# Patient Record
Sex: Female | Born: 2000 | Race: Black or African American | Hispanic: No | Marital: Single | Smoking: Never smoker
Health system: Southern US, Community
[De-identification: ages and names within clinical notes are randomized; demographics above are authoritative.]

## PROBLEM LIST (undated history)

## (undated) ENCOUNTER — Ambulatory Visit (HOSPITAL_COMMUNITY): Admission: EM | Payer: BC Managed Care – PPO | Source: Home / Self Care

## (undated) ENCOUNTER — Emergency Department (HOSPITAL_COMMUNITY): Admission: EM | Payer: Self-pay | Source: Home / Self Care

## (undated) DIAGNOSIS — G4730 Sleep apnea, unspecified: Secondary | ICD-10-CM

## (undated) DIAGNOSIS — R87610 Atypical squamous cells of undetermined significance on cytologic smear of cervix (ASC-US): Secondary | ICD-10-CM

## (undated) DIAGNOSIS — E109 Type 1 diabetes mellitus without complications: Secondary | ICD-10-CM

## (undated) DIAGNOSIS — E559 Vitamin D deficiency, unspecified: Secondary | ICD-10-CM

## (undated) DIAGNOSIS — Z789 Other specified health status: Secondary | ICD-10-CM

## (undated) HISTORY — DX: Type 1 diabetes mellitus without complications: E10.9

## (undated) HISTORY — DX: Vitamin D deficiency, unspecified: E55.9

## (undated) HISTORY — DX: Atypical squamous cells of undetermined significance on cytologic smear of cervix (ASC-US): R87.610

## (undated) HISTORY — DX: Sleep apnea, unspecified: G47.30

---
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
## 2021-03-11 ENCOUNTER — Encounter: Payer: Self-pay | Admitting: Nurse Practitioner

## 2021-03-25 ENCOUNTER — Ambulatory Visit (INDEPENDENT_AMBULATORY_CARE_PROVIDER_SITE_OTHER): Payer: Self-pay | Admitting: Nurse Practitioner

## 2021-03-25 ENCOUNTER — Encounter: Payer: Self-pay | Admitting: Nurse Practitioner

## 2021-03-25 ENCOUNTER — Other Ambulatory Visit (INDEPENDENT_AMBULATORY_CARE_PROVIDER_SITE_OTHER): Payer: 59

## 2021-03-25 VITALS — BP 108/64 | HR 67 | Ht 67.0 in | Wt 182.0 lb

## 2021-03-25 DIAGNOSIS — R197 Diarrhea, unspecified: Secondary | ICD-10-CM

## 2021-03-25 DIAGNOSIS — R109 Unspecified abdominal pain: Secondary | ICD-10-CM

## 2021-03-25 LAB — SEDIMENTATION RATE: Sed Rate: 26 mm/hr — ABNORMAL HIGH (ref 0–20)

## 2021-03-25 LAB — CBC
HCT: 42.8 % (ref 36.0–49.0)
Hemoglobin: 13.9 g/dL (ref 12.0–16.0)
MCHC: 32.5 g/dL (ref 31.0–37.0)
MCV: 79.4 fl (ref 78.0–98.0)
Platelets: 259 10*3/uL (ref 150.0–575.0)
RBC: 5.39 Mil/uL (ref 3.80–5.70)
RDW: 14.5 % (ref 11.4–15.5)
WBC: 7.3 10*3/uL (ref 4.5–13.5)

## 2021-03-25 LAB — C-REACTIVE PROTEIN: CRP: <1 mg/dL (ref 0.5–20.0)

## 2021-03-25 MED ORDER — DICYCLOMINE HCL 10 MG PO CAPS: 10.0000 mg | ORAL_CAPSULE | Freq: Two times a day (BID) | ORAL | 2 refills | Status: DC | PRN

## 2021-03-25 NOTE — Patient Instructions (Signed)
Your provider has requested that you go to the basement level for lab work before leaving today. Press "B" on the elevator. The lab is located at the first door on the left as you exit the elevator.  We have sent the following medications to your pharmacy for you to pick up at your convenience: Bentyl 10 mg twice daily as needed for cramps  Please purchase the following medications over the counter and take as directed: Imodium 2 mg twice daily  If you are age 100 or younger, your body mass index should be between 19-25. Your Body mass index is 28.51 kg/m. If this is out of the aformentioned range listed, please consider follow up with your Primary Care Provider.   Due to recent changes in healthcare laws, you may see the results of your imaging and laboratory studies on MyChart before your provider has had a chance to review them.  We understand that in some cases there may be results that are confusing or concerning to you. Not all laboratory results come back in the same time frame and the provider may be waiting for multiple results in order to interpret others.  Please give [place] 48 hours in order for your provider to thoroughly review all the results before contacting the office for clarification of your results.

## 2021-03-25 NOTE — Progress Notes (Signed)
Reviewed and agree with management plans. Will include testing for celiac and fecal calprotectin in her evaluation.   Kasee Hantz L. Orvan Falconer, MD, MPH

## 2021-03-25 NOTE — Progress Notes (Signed)
ASSESSMENT AND PLAN    # 20 yo female with chronic, crampy diarrhea for years. Strong family history of Crohn's disease but I suspect she has IBS.  --CBC ESR, CRP --Trial of Dicyclomine 10 mg TID prn cramps --Trial of Imodium 2 mg BID. Hold for constipation  --Avoid trigger foods --Follow up with me in a few weeks.   # Anxiety, significant per mother.   HISTORY OF PRESENT ILLNESS     Chief Complaint : Abdominal pain  Kristina Sparks is a 20 y.o. female with a past medical history significant for anxiety. See additional PMH below.   Patient is new to the practice, self referred for diarrhea and abdominal cramps.  In middle school patient was chronically constipated and required MiraLAX.  The MiraLAX eventually stopped working so she discontinued it.  At that point she started eating more vegetables and began having problems with loose stool.  Over the last several years her bowel habits have mainly consisted of several loose stools a day, mainly postprandial.  She has not seen any blood in her stool.  Bowel movements are often preceded by lower abdominal cramping which is mitigated but not totally alleviated with defecation.  Diarrhea is definitely worse when she is anxious/stressed.  Diarrhea is also exacerbated by consumption of greasy foods and dairy. In the last six months she has occasionally woken up during the night to have a BM.  Patient's mother is here today and helps with the history.  She offers that the patient is often severely anxious and she has had a heavy class schedule in college.   Kristina Sparks has not tried anything over the counter to help with the diarrhea.  She will sometimes take motrin for abdominal cramps.  She endorses generalized achiness and also also frequent aching in her right hand and both knees.   Patient's maternal greatgradmother, maternal great uncle, and uncle all have Crohn's.   Past Medical History:  Diagnosis Date  . Medical history  non-contributory      No past surgical history on file. Family History  Problem Relation Age of Onset  . Cancer Paternal Grandmother   . Breast cancer Maternal Grandmother    Social History   Tobacco Use  . Smoking status: Never Smoker  . Smokeless tobacco: Never Used  Vaping Use  . Vaping Use: Never used  Substance Use Topics  . Alcohol use: No  . Drug use: No   Current Outpatient Medications  Medication Sig Dispense Refill  . acetaminophen (TYLENOL) 325 MG tablet Take 2 tablets (650 mg total) by mouth every 6 (six) hours as needed. 30 tablet 0  . cetirizine (ZYRTEC ALLERGY) 10 MG tablet Take 1 tablet (10 mg total) by mouth daily. 14 tablet 0  . Elderberry 500 MG CAPS Take 1 capsule by mouth daily.    . Multiple Vitamins-Minerals (MULTIVITAMIN PO) Take 1 tablet by mouth daily.     No current facility-administered medications for this visit.   No Known Allergies   Review of Systems: Positive for allergies, sinus trouble, anxiety, back pain, depression, menstrual pain, sleeping problems.  All other systems reviewed and negative except where noted in HPI.    PHYSICAL EXAM :    Wt Readings from Last 3 Encounters:  03/25/21 182 lb (82.6 kg) (95 %, Z= 1.68)*  09/17/19 180 lb (81.6 kg) (96 %, Z= 1.72)*  06/16/17 166 lb 0.1 oz (75.3 kg) (95 %, Z= 1.63)*   * Growth percentiles are based  on CDC (Girls, 2-20 Years) data.    Ht 5' 7"  (1.702 m)   Wt 182 lb (82.6 kg)   BMI 28.51 kg/m  Constitutional:  Pleasant female in no acute distress. Psychiatric: Normal mood and affect. Behavior is normal. EENT: Pupils normal.  Conjunctivae are normal. No scleral icterus. Neck supple.  Cardiovascular: Normal rate, regular rhythm. No edema Pulmonary/chest: Effort normal and breath sounds normal. No wheezing, rales or rhonchi. Abdominal: Soft, nondistended, nontender. Bowel sounds active throughout. There are no masses palpable. No hepatomegaly. Neurological: Alert and oriented to  person place and time. Skin: Skin is warm and dry. No rashes noted.  Tye Savoy, NP  03/25/2021, 8:37 AM

## 2021-03-26 ENCOUNTER — Other Ambulatory Visit: Payer: Self-pay

## 2021-03-26 DIAGNOSIS — R197 Diarrhea, unspecified: Secondary | ICD-10-CM

## 2021-08-11 ENCOUNTER — Encounter (HOSPITAL_COMMUNITY): Payer: Self-pay | Admitting: Emergency Medicine

## 2021-08-11 ENCOUNTER — Ambulatory Visit (HOSPITAL_COMMUNITY)
Admission: EM | Admit: 2021-08-11 | Discharge: 2021-08-11 | Disposition: A | Discharge status: Home / Self Care | Payer: Self-pay | Attending: Internal Medicine | Admitting: Internal Medicine

## 2021-08-11 ENCOUNTER — Other Ambulatory Visit: Payer: Self-pay

## 2021-08-11 DIAGNOSIS — Z113 Encounter for screening for infections with a predominantly sexual mode of transmission: Secondary | ICD-10-CM | POA: Insufficient documentation

## 2021-08-11 DIAGNOSIS — N898 Other specified noninflammatory disorders of vagina: Secondary | ICD-10-CM | POA: Insufficient documentation

## 2021-08-11 DIAGNOSIS — L0291 Cutaneous abscess, unspecified: Secondary | ICD-10-CM | POA: Insufficient documentation

## 2021-08-11 MED ORDER — FLUCONAZOLE 150 MG PO TABS: ORAL_TABLET | ORAL | 0 refills | Status: DC

## 2021-08-11 MED ORDER — DOXYCYCLINE HYCLATE 100 MG PO CAPS: 100.0000 mg | ORAL_CAPSULE | Freq: Two times a day (BID) | ORAL | 0 refills | Status: DC

## 2021-08-11 NOTE — Discharge Instructions (Addendum)
Avoid wearing polyester underwear and only use cotton Keep your vaginal are dry as best as you can Apply heat on area for 20 minutes 2-4 times a day for 5-7 days or til it drains. If the area gets larger without draining, then come back for [place] to drain it.

## 2021-08-11 NOTE — ED Triage Notes (Addendum)
Pt is present today with a mass on the lower left side of her groin. Pt states that she noticed it Sunday night. Pt also has urine frequency, dysuria, vaginal discharge, and vaginal irritation that started one week ago.

## 2021-08-12 LAB — CERVICOVAGINAL ANCILLARY ONLY
Bacterial Vaginitis (gardnerella): NEGATIVE
Candida Glabrata: NEGATIVE
Candida Vaginitis: POSITIVE — AB
Chlamydia: NEGATIVE
Comment: NEGATIVE
Comment: NEGATIVE
Comment: NEGATIVE
Comment: NEGATIVE
Comment: NEGATIVE
Comment: NORMAL
Neisseria Gonorrhea: NEGATIVE
Trichomonas: NEGATIVE

## 2021-08-30 ENCOUNTER — Other Ambulatory Visit: Payer: Self-pay

## 2021-08-30 ENCOUNTER — Observation Stay (HOSPITAL_COMMUNITY)
Admission: EM | Admit: 2021-08-30 | Discharge: 2021-09-01 | Disposition: A | Discharge status: Home / Self Care | Payer: Medicaid Other | Attending: Internal Medicine | Admitting: Internal Medicine

## 2021-08-30 ENCOUNTER — Encounter (HOSPITAL_COMMUNITY): Payer: Self-pay

## 2021-08-30 ENCOUNTER — Emergency Department (HOSPITAL_COMMUNITY): Payer: Medicaid Other

## 2021-08-30 DIAGNOSIS — B3731 Acute candidiasis of vulva and vagina: Secondary | ICD-10-CM

## 2021-08-30 DIAGNOSIS — Z79899 Other long term (current) drug therapy: Secondary | ICD-10-CM | POA: Insufficient documentation

## 2021-08-30 DIAGNOSIS — N939 Abnormal uterine and vaginal bleeding, unspecified: Principal | ICD-10-CM | POA: Insufficient documentation

## 2021-08-30 DIAGNOSIS — E119 Type 2 diabetes mellitus without complications: Secondary | ICD-10-CM

## 2021-08-30 DIAGNOSIS — E109 Type 1 diabetes mellitus without complications: Secondary | ICD-10-CM | POA: Insufficient documentation

## 2021-08-30 DIAGNOSIS — Z794 Long term (current) use of insulin: Secondary | ICD-10-CM | POA: Insufficient documentation

## 2021-08-30 DIAGNOSIS — Z20822 Contact with and (suspected) exposure to covid-19: Secondary | ICD-10-CM | POA: Insufficient documentation

## 2021-08-30 LAB — COMPREHENSIVE METABOLIC PANEL
ALT: 35 U/L (ref 0–44)
AST: 27 U/L (ref 15–41)
Albumin: 4.4 g/dL (ref 3.5–5.0)
Alkaline Phosphatase: 76 U/L (ref 38–126)
Anion gap: 12 (ref 5–15)
BUN: 12 mg/dL (ref 6–20)
CO2: 23 mmol/L (ref 22–32)
Calcium: 9.2 mg/dL (ref 8.9–10.3)
Chloride: 98 mmol/L (ref 98–111)
Creatinine, Ser: 0.76 mg/dL (ref 0.44–1.00)
GFR, Estimated: >60 mL/min (ref >60)
Glucose, Bld: 347 mg/dL — ABNORMAL HIGH (ref 70–99)
Potassium: 3.5 mmol/L (ref 3.5–5.1)
Sodium: 133 mmol/L — ABNORMAL LOW (ref 135–145)
Total Bilirubin: 0.7 mg/dL (ref 0.3–1.2)
Total Protein: 8.8 g/dL — ABNORMAL HIGH (ref 6.5–8.1)

## 2021-08-30 LAB — CBC WITH DIFFERENTIAL/PLATELET
Abs Immature Granulocytes: 0.01 10*3/uL (ref 0.00–0.07)
Basophils Absolute: 0 10*3/uL (ref 0.0–0.1)
Basophils Relative: 1 %
Eosinophils Absolute: 0.1 10*3/uL (ref 0.0–0.5)
Eosinophils Relative: 2 %
HCT: 45.7 % (ref 36.0–46.0)
Hemoglobin: 15 g/dL (ref 12.0–15.0)
Immature Granulocytes: 0 %
Lymphocytes Relative: 51 %
Lymphs Abs: 3.3 10*3/uL (ref 0.7–4.0)
MCH: 26.2 pg (ref 26.0–34.0)
MCHC: 32.8 g/dL (ref 30.0–36.0)
MCV: 79.9 fL — ABNORMAL LOW (ref 80.0–100.0)
Monocytes Absolute: 0.3 10*3/uL (ref 0.1–1.0)
Monocytes Relative: 5 %
Neutro Abs: 2.6 10*3/uL (ref 1.7–7.7)
Neutrophils Relative %: 41 %
Platelets: 322 10*3/uL (ref 150–400)
RBC: 5.72 MIL/uL — ABNORMAL HIGH (ref 3.87–5.11)
RDW: 12.9 % (ref 11.5–15.5)
WBC: 6.4 10*3/uL (ref 4.0–10.5)
nRBC: 0 % (ref 0.0–0.2)

## 2021-08-30 LAB — CBG MONITORING, ED: Glucose-Capillary: 356 mg/dL — ABNORMAL HIGH (ref 70–99)

## 2021-08-30 LAB — BETA-HYDROXYBUTYRIC ACID: Beta-Hydroxybutyric Acid: 1.15 mmol/L — ABNORMAL HIGH (ref 0.05–0.27)

## 2021-08-30 LAB — I-STAT BETA HCG BLOOD, ED (MC, WL, AP ONLY): I-stat hCG, quantitative: <5 m[IU]/mL (ref <5)

## 2021-08-30 NOTE — ED Notes (Signed)
Pt. CBG 356, RN, TJ made aware.

## 2021-08-30 NOTE — ED Triage Notes (Signed)
Pt states that she went to [facility] today and was told that her blood glucose was in the 400's. Pt does not report a history of diabetes. Pt also states that she has been recently experiencing heavy vaginal bleeding that is not her period.

## 2021-08-30 NOTE — ED Provider Notes (Signed)
Emergency Medicine Provider Triage Evaluation Note  Kristina Sparks , a 20 y.o. female  was evaluated in triage.  Pt complains of vaginal bleeding, pelvic pain, generalized abdominal pain, and hyperglycemia.  Patient has been having vaginal bleeding, pelvic, and generalized abdominal pain over the last 3 to 4 days.  Patient reports that she is changing her pad or tampon every 2 hours.  Patient reports that she is passing large blood clots  Patient had blood work done earlier today which showed her blood sugar was greater than 400.  No history of diabetes.  Patient has had polydipsia and polyuria  Review of Systems  Positive: Vaginal bleeding, pelvic pain, abdominal pain, polyuria, polydipsia, dysuria, Negative:   Physical Exam  BP 108/83 (BP Location: Left Arm)   Pulse 70   Temp 98.1 F (36.7 C) (Oral)   Resp 16   SpO2 98%  Gen:   Awake, no distress   Resp:  Normal effort  MSK:   Moves extremities without difficulty  Other:  Abdomen soft, nondistended, nontender.  Medical Decision Making  Medically screening exam initiated at 8:41 PM.  Appropriate orders placed.  Elsia Lasota Rothery was informed that the remainder of the evaluation will be completed by another provider, this initial triage assessment does not replace that evaluation, and the importance of remaining in the ED until their evaluation is complete.     Berneice Heinrich 08/30/21 2042    Wynetta Fines, MD 08/30/21 [phone]

## 2021-08-30 NOTE — ED Notes (Signed)
Attempted lab draw, pt. Became agitated and refused lab draw. RN, TJ made aware.

## 2021-08-31 ENCOUNTER — Encounter (HOSPITAL_COMMUNITY): Payer: Self-pay | Admitting: Internal Medicine

## 2021-08-31 DIAGNOSIS — N939 Abnormal uterine and vaginal bleeding, unspecified: Secondary | ICD-10-CM | POA: Diagnosis present

## 2021-08-31 DIAGNOSIS — E119 Type 2 diabetes mellitus without complications: Secondary | ICD-10-CM

## 2021-08-31 LAB — URINALYSIS, ROUTINE W REFLEX MICROSCOPIC
Bacteria, UA: NONE SEEN
Bilirubin Urine: NEGATIVE
Glucose, UA: >=500 mg/dL — AB
Ketones, ur: 80 mg/dL — AB
Nitrite: NEGATIVE
Protein, ur: NEGATIVE mg/dL
Specific Gravity, Urine: 1.042 — ABNORMAL HIGH (ref 1.005–1.030)
pH: 5 (ref 5.0–8.0)

## 2021-08-31 LAB — WET PREP, GENITAL
Clue Cells Wet Prep HPF POC: NONE SEEN
Sperm: NONE SEEN
Trich, Wet Prep: NONE SEEN
Yeast Wet Prep HPF POC: NONE SEEN

## 2021-08-31 LAB — CBG MONITORING, ED
Glucose-Capillary: 338 mg/dL — ABNORMAL HIGH (ref 70–99)
Glucose-Capillary: 236 mg/dL — ABNORMAL HIGH (ref 70–99)
Glucose-Capillary: 220 mg/dL — ABNORMAL HIGH (ref 70–99)
Glucose-Capillary: 232 mg/dL — ABNORMAL HIGH (ref 70–99)

## 2021-08-31 LAB — HEMOGLOBIN A1C
Hgb A1c MFr Bld: 13.3 % — ABNORMAL HIGH (ref 4.8–5.6)
Mean Plasma Glucose: 335.01 mg/dL

## 2021-08-31 LAB — RESP PANEL BY RT-PCR (FLU A&B, COVID) ARPGX2
Influenza A by PCR: NEGATIVE
Influenza B by PCR: NEGATIVE
SARS Coronavirus 2 by RT PCR: NEGATIVE

## 2021-08-31 MED ORDER — INSULIN ASPART 100 UNIT/ML IJ SOLN
5.0000 [IU] | Freq: Once | INTRAMUSCULAR | Status: AC
  Administered 2021-08-31: 5 [IU] via SUBCUTANEOUS
  Filled 2021-08-31: qty 0.05

## 2021-08-31 MED ORDER — ACETAMINOPHEN 650 MG RE SUPP: 650.0000 mg | Freq: Four times a day (QID) | RECTAL | Status: DC | PRN

## 2021-08-31 MED ORDER — INSULIN ASPART 100 UNIT/ML IJ SOLN
0.0000 [IU] | Freq: Three times a day (TID) | INTRAMUSCULAR | Status: DC
  Administered 2021-08-31 (×2): 3 [IU] via SUBCUTANEOUS
  Administered 2021-09-01 (×2): 5 [IU] via SUBCUTANEOUS
  Filled 2021-08-31: qty 0.09

## 2021-08-31 MED ORDER — ACETAMINOPHEN 325 MG PO TABS
650.0000 mg | ORAL_TABLET | Freq: Four times a day (QID) | ORAL | Status: DC | PRN
  Administered 2021-08-31: 650 mg via ORAL
  Filled 2021-08-31: qty 2

## 2021-08-31 MED ORDER — ONDANSETRON HCL 4 MG PO TABS: 4.0000 mg | ORAL_TABLET | Freq: Four times a day (QID) | ORAL | Status: DC | PRN

## 2021-08-31 MED ORDER — INSULIN GLARGINE-YFGN 100 UNIT/ML ~~LOC~~ SOLN
8.0000 [IU] | Freq: Every day | SUBCUTANEOUS | Status: DC
  Administered 2021-08-31 – 2021-09-01 (×2): 8 [IU] via SUBCUTANEOUS
  Filled 2021-08-31 (×2): qty 0.08

## 2021-08-31 MED ORDER — SODIUM CHLORIDE 0.45 % IV SOLN: INTRAVENOUS | Status: DC

## 2021-08-31 MED ORDER — SODIUM CHLORIDE 0.45 % IV BOLUS
1000.0000 mL | Freq: Once | INTRAVENOUS | Status: AC
  Administered 2021-08-31: 1000 mL via INTRAVENOUS

## 2021-08-31 MED ORDER — ONDANSETRON HCL 4 MG/2ML IJ SOLN: 4.0000 mg | Freq: Four times a day (QID) | INTRAMUSCULAR | Status: DC | PRN

## 2021-08-31 MED ORDER — POTASSIUM CHLORIDE CRYS ER 20 MEQ PO TBCR
20.0000 meq | EXTENDED_RELEASE_TABLET | Freq: Once | ORAL | Status: AC
  Administered 2021-08-31: 20 meq via ORAL
  Filled 2021-08-31: qty 1

## 2021-08-31 MED ORDER — ENOXAPARIN SODIUM 40 MG/0.4ML IJ SOSY: 40.0000 mg | PREFILLED_SYRINGE | INTRAMUSCULAR | Status: DC

## 2021-08-31 MED ORDER — INSULIN STARTER KIT- PEN NEEDLES (ENGLISH)
1.0000 | Freq: Once | Status: AC
  Administered 2021-09-01: 1
  Filled 2021-08-31: qty 1

## 2021-08-31 MED ORDER — LIVING WELL WITH DIABETES BOOK
Freq: Once | Status: DC
  Filled 2021-08-31: qty 1

## 2021-08-31 NOTE — ED Notes (Signed)
Pt walked to restroom without assistance.

## 2021-08-31 NOTE — H&P (Signed)
History and Physical    Kristina Sparks:[id number] DOB: Sep 18, 2001 DOA: 08/30/2021  PCP: Leighton Ruff, NP   Patient coming from: Home.   I have personally briefly reviewed patient's old medical records in [facility]  Chief Complaint: Vaginal bleeding.  HPI: Kristina Sparks is a 20 y.o. female with medical history significant of of sleep apnea not on CPAP who is coming to the emergency department due to vaginal bleeding associated with lower abdominal cramping for the past 3 to 4 days.  She does not use birth control medication.  She has not had similar instances in the past.  She was recently treated for pelvic abscess and due to infection and was noted to have glucosuria and a urinalysis.  She has also been extremely tired with polydipsia and polyuria.  Otherwise denied fever, chills, sore throat, rhinorrhea dyspnea, wheezing or hemoptysis.  No chest pain, dizziness, palpitations, diaphoresis, PND, orthopnea or pitting edema of the lower extremities.  Denied nausea, vomiting, diarrhea, constipation, melena hematochezia.  No dysuria, flank pain, frequency or hematuria.  ED Course: Initial vital signs were temperature 98.1 F pulse 70, respiration 16, BP 108/83 mmHg O2 sat 98%.  He was given IV fluids and 5 units of regular insulin.  Lab work: Her urinalysis showed increase of specific gravity, glucosuria, ketonuria of 80 mg/dL with a large hemoglobin and a small leukocyte esterase.  No bacteria on microscopic examination.  CBC showed white count 6.4, hemoglobin 15.0 g/dL and platelets 846.  Review of Systems: As per HPI otherwise all other systems reviewed and are negative.  Past Medical History:  Diagnosis Date   Sleep apnea    History reviewed. No pertinent surgical history.  Social History  reports that she has never smoked. She has never used smokeless tobacco. She reports that she does not drink alcohol and does not use drugs.  No Known Allergies  Family History   Problem Relation Age of Onset   Cancer Paternal Grandmother    Breast cancer Maternal Grandmother    Throat cancer Maternal Grandmother    Crohn's disease Maternal Uncle    Prior to Admission medications   Medication Sig Start Date End Date Taking? Authorizing Provider  cetirizine (ZYRTEC ALLERGY) 10 MG tablet Take 1 tablet (10 mg total) by mouth daily. Patient taking differently: Take 10 mg by mouth daily as needed for allergies. 07/07/20  Yes Darr, Gerilyn Pilgrim, PA-C  dicyclomine (BENTYL) 10 MG capsule Take 1 capsule (10 mg total) by mouth 2 (two) times daily as needed (cramps). Patient not taking: Reported on 08/31/2021 03/25/21   Meredith Pel, NP  doxycycline (VIBRAMYCIN) 100 MG capsule Take 1 capsule (100 mg total) by mouth 2 (two) times daily. Patient not taking: Reported on 08/31/2021 08/11/21   Rodriguez-Southworth, Nettie Elm, PA-C  fluconazole (DIFLUCAN) 150 MG tablet One qd x 3 days, then one at the when done with antibiotic Patient not taking: Reported on 08/31/2021 08/11/21   Garey Ham, PA-C    Physical Exam: Vitals:   08/31/21 1200 08/31/21 1230 08/31/21 1300 08/31/21 1330  BP: 100/72 99/70 (!) 87/47 100/62  Pulse: 77 69 86 86  Resp: 18 17  16   Temp:      TempSrc:      SpO2: 100% 100% 100% 100%   Constitutional: NAD, calm, comfortable Eyes: PERRL, lids and conjunctivae normal ENMT: Mucous membranes are moist. Posterior pharynx clear of any exudate or lesions. Neck: normal, supple, no masses, no thyromegaly Respiratory: clear to auscultation bilaterally, no  wheezing, no crackles. Normal respiratory effort. No accessory muscle use.  Cardiovascular: Regular rate and rhythm, no murmurs / rubs / gallops. No extremity edema. 2+ pedal pulses. No carotid bruits.  Abdomen: Soft, no tenderness, no masses palpated. No hepatosplenomegaly. Bowel sounds positive.  Musculoskeletal: no clubbing / cyanosis. No joint deformity upper and lower extremities. Good ROM, no  contractures. Normal muscle tone.  Skin: no rashes, lesions, ulcers. No induration Neurologic: CN 2-12 grossly intact. Sensation intact, DTR normal. Strength 5/5 in all 4.  Psychiatric: Normal judgment and insight. Alert and oriented x 3. Normal mood.   Labs on Admission: I have personally reviewed following labs and imaging studies  CBC: Recent Labs  Lab 08/30/21 2143  WBC 6.4  NEUTROABS 2.6  HGB 15.0  HCT 45.7  MCV 79.9*  PLT 322   Basic Metabolic Panel: Recent Labs  Lab 08/30/21 2143  NA 133*  K 3.5  CL 98  CO2 23  GLUCOSE 347*  BUN 12  CREATININE 0.76  CALCIUM 9.2    GFR: CrCl cannot be calculated (Unknown ideal weight.).  Liver Function Tests: Recent Labs  Lab 08/30/21 2143  AST 27  ALT 35  ALKPHOS 76  BILITOT 0.7  PROT 8.8*  ALBUMIN 4.4   Urine analysis:    Component Value Date/Time   COLORURINE YELLOW 08/31/2021 0230   APPEARANCEUR CLEAR 08/31/2021 0230   LABSPEC 1.042 (H) 08/31/2021 0230   PHURINE 5.0 08/31/2021 0230   GLUCOSEU >=500 (A) 08/31/2021 0230   HGBUR LARGE (A) 08/31/2021 0230   BILIRUBINUR NEGATIVE 08/31/2021 0230   KETONESUR 80 (A) 08/31/2021 0230   PROTEINUR NEGATIVE 08/31/2021 0230        NITRITE NEGATIVE 08/31/2021 0230   LEUKOCYTESUR SMALL (A) 08/31/2021 0230   Radiological Exams on Admission: US Transvaginal Non-OB  Result Date: 08/30/2021 CLINICAL DATA:  Pelvic pain. EXAM: TRANSABDOMINAL AND TRANSVAGINAL ULTRASOUND OF PELVIS TECHNIQUE: Both transabdominal and transvaginal ultrasound examinations of the pelvis were performed. Transabdominal technique was performed for global imaging of the pelvis including uterus, ovaries, adnexal regions, and pelvic cul-de-sac. It was necessary to proceed with endovaginal exam following the transabdominal exam to visualize the uterus, endometrium, bilateral ovaries and bilateral adnexa. COMPARISON:  None FINDINGS: Uterus Measurements: 7.5 cm x 2.7 cm x 3.9 cm = volume: 41.0 mL. No fibroids or  other mass visualized. Endometrium Thickness: 2.1 mm.  No focal abnormality visualized. Right ovary Measurements: 3.7 cm x 1.6 cm x 2.2 cm = volume: 6.7 mL. Normal appearance/no adnexal mass. Left ovary Measurements: 2.6 cm x 1.9 cm x 1.4 cm = volume: 3.5 mL. Normal appearance/no adnexal mass. Other findings:  No abnormal free fluid. IMPRESSION: Normal pelvic ultrasound. Electronically Signed   By: Aram Candela M.D.   On: 08/30/2021 21:53   US Pelvis Complete  Result Date: 08/30/2021 CLINICAL DATA:  Pelvic pain. EXAM: TRANSABDOMINAL ULTRASOUND OF PELVIS TECHNIQUE: Transabdominal ultrasound examination of the pelvis was performed including evaluation of the uterus, ovaries, adnexal regions, and pelvic cul-de-sac. COMPARISON:  None. FINDINGS: Uterus Measurements: 7.5 cm x 2.7 cm x 3.9 cm = volume: 41.0 mL. No fibroids or other mass visualized. Endometrium Thickness: 2.1 mm.  No focal abnormality visualized. Right ovary Measurements: 3.7 cm x 1.6 cm x 2.2 cm = volume: 6.7 mL. Normal appearance/no adnexal mass. Left ovary Measurements: 2.6 cm x 1.9 cm x 1.4 cm = volume: 3.5 mL. Normal appearance/no adnexal mass. Other findings:  No abnormal free fluid. IMPRESSION: Normal pelvic ultrasound. Electronically Signed   By:  Aram Candela M.D.   On: 08/30/2021 21:53    EKG: Independently reviewed.   Assessment/Plan Principal Problem:   Diabetes mellitus type I, new onset (HCC) Observation/MedSurg. Continue IV fluids. Check hemoglobin A1c. Carbohydrate modified diet. Insulin glargine 8 units daily. CBG monitoring with RI SS. Needs to establish with a PCP. Needs to be referred to endocrinology.  Active Problems:   Vaginal bleeding H&H stable. Ultrasound was normal. Follow-up H&H Follow-up with GYN.    DVT prophylaxis: SCDs. Code Status:   Full code. Family Communication:   Disposition Plan:   Patient is from:  Home.  Anticipated DC to:  Home.  Anticipated DC  date:  09/01/2021.  Anticipated DC barriers: Clinical status/discharge planning.  Consults called:  Diabetes coordinator and TOC team. Admission status:  Observation/telemetry.   Severity of Illness: High severity with new onset diabetes presented with vaginal bleeding, ketonuria, hyperglycemia mildly elevated beta hydroxybutyric acid.  The patient will remain for IV hydration and to begin treatment with insulin.  Bobette Mo MD [facility]  How to contact the [facility] Attending or Consulting provider 7A - 7P or covering provider during after hours 7P -7A, for this patient?   Check the care team in [facility] and look for a) attending/consulting TR[facility] provider listed and b) the [facility] team listed Log into [email or web address] and use [place]'s universal password to access. If you do not have the password, please contact the hospital operator. Locate the [facility] provider you are looking for under [facility] and page to a number that you can be directly reached. If you still have difficulty reaching the provider, please page the  [facility] (Director on Call) for the Hospitalists listed on amion for assistance.  08/31/2021, 1:41 PM   This document was prepared using Dragon voice recognition software may contain some unintended transcription errors.

## 2021-08-31 NOTE — ED Notes (Signed)
Pt is tearful and seems frustrated and overwhelmed with her diagnosis.  She is saying she would like to go home and does not want her CBG checked as her fingers are all very sore. Mother is supportive and remains at the bedside.

## 2021-08-31 NOTE — ED Notes (Signed)
Pelvic stuff at bedside.

## 2021-08-31 NOTE — Progress Notes (Signed)
Inpatient Diabetes Program Recommendations  AACE/ADA: New Consensus Statement on Inpatient Glycemic Control (2015)  Target Ranges:  Prepandial:   less than 140 mg/dL      Peak postprandial:   less than 180 mg/dL (1-2 hours)      Critically ill patients:  140 - 180 mg/dL   Lab Results  Component Value Date   GLUCAP 236 (H) 08/31/2021   HGBA1C 13.3 (H) 08/30/2021    Review of Glycemic Control  Diabetes history: New-onset DM Outpatient Diabetes medications: None Current orders for Inpatient glycemic control: Semglee 8 units daily, Novolog 0-9 units TID with meals  HgbA1C - 13.3% Lost approx 10-12 pounds over past month. CBGs 338, 236 mg/dL thus far today.   Inpatient Diabetes Program Recommendations:    Needs BMET (last one 08/30/21 at 2143)  Add Novolog 0-5 units HS  Will likely need meal coverage insulin. If post-prandials > 180 mg/dL, begin Novolog 3 units TID for meal coverage if eating > 50% meal.  Order Living Well with Diabetes book and Insulin Pen starter kit - will teach insulin pen administration in am.   Continue diabetes education at bedside.   Thank you. Lorenda Peck, RD, LDN, CDE Inpatient Diabetes Coordinator [phone]

## 2021-08-31 NOTE — ED Provider Notes (Signed)
[facility]-EMERGENCY DEPT Provider Note   CSN: [id number] Arrival date & time: 08/30/21  1956     History Chief Complaint  Patient presents with   Vaginal Bleeding   Hyperglycemia    Kristina Sparks is a 20 y.o. female who presents with cramping and vaginal bleeding. She reports that she has been having heavy vaginal bleeding and clotting for the past 3 or 4 days.  Patient is a current Theatre stage manager at [employer].  She has been extremely tired and attributed that to school.  Patient complains of excessive thirst and urination.  She recently had a pelvic abscess and yeast infection and was noted to have glucosuria at her visit.   Vaginal Bleeding Hyperglycemia Associated symptoms: increased thirst and polyuria       Past Medical History:  Diagnosis Date   Sleep apnea     Patient Active Problem List   Diagnosis Date Noted   Breast abscess 06/16/2017    History reviewed. No pertinent surgical history.   OB History   No obstetric history on file.     Family History  Problem Relation Age of Onset   Cancer Paternal Grandmother    Breast cancer Maternal Grandmother    Throat cancer Maternal Grandmother    Crohn's disease Maternal Uncle     Social History   Tobacco Use   Smoking status: Never   Smokeless tobacco: Never  Vaping Use   Vaping Use: Never used  Substance Use Topics   Alcohol use: No   Drug use: No    Home Medications Prior to Admission medications   Medication Sig Start Date End Date Taking? Authorizing Provider  acetaminophen (TYLENOL) 325 MG tablet Take 2 tablets (650 mg total) by mouth every 6 (six) hours as needed. 07/07/20   Darr, Gerilyn Pilgrim, PA-C  cetirizine (ZYRTEC ALLERGY) 10 MG tablet Take 1 tablet (10 mg total) by mouth daily. 07/07/20   Darr, Gerilyn Pilgrim, PA-C  dicyclomine (BENTYL) 10 MG capsule Take 1 capsule (10 mg total) by mouth 2 (two) times daily as needed (cramps). 03/25/21   Meredith Pel, NP  doxycycline  (VIBRAMYCIN) 100 MG capsule Take 1 capsule (100 mg total) by mouth 2 (two) times daily. 08/11/21   Rodriguez-Southworth, Nettie Elm, PA-C  fluconazole (DIFLUCAN) 150 MG tablet One qd x 3 days, then one at the when done with antibiotic 08/11/21   Rodriguez-Southworth, Nettie Elm, PA-C    Allergies    Patient has no known allergies.  Review of Systems   Review of Systems  Endocrine: Positive for polydipsia and polyuria.  Genitourinary:  Positive for vaginal bleeding.   Physical Exam Updated Vital Signs BP 123/79   Pulse 78   Temp 97.9 F (36.6 C) (Oral)   Resp 16   SpO2 100%   Physical Exam Vitals and nursing note reviewed.  Constitutional:      General: She is not in acute distress.    Appearance: She is well-developed. She is not diaphoretic.  HENT:     Head: Normocephalic and atraumatic.     Right Ear: External ear normal.     Left Ear: External ear normal.     Nose: Nose normal.     Mouth/Throat:     Mouth: Mucous membranes are moist.  Eyes:     General: No scleral icterus.    Conjunctiva/sclera: Conjunctivae normal.  Cardiovascular:     Rate and Rhythm: Normal rate and regular rhythm.     Heart sounds: Normal heart sounds. No  murmur heard.   No friction rub. No gallop.  Pulmonary:     Effort: Pulmonary effort is normal. No respiratory distress.     Breath sounds: Normal breath sounds.  Abdominal:     General: Bowel sounds are normal. There is no distension.     Palpations: Abdomen is soft. There is no mass.     Tenderness: There is no abdominal tenderness. There is no guarding.  Genitourinary:    Comments: Patient profile with mild irritation and white crusting consistent with vulvovaginitis.  Normal vaginal tissue without obvious discharge.  There is mild bleeding from the cervical os.  No cervical motion tenderness, adnexal fullness or tenderness. Musculoskeletal:     Cervical back: Normal range of motion.  Skin:    General: Skin is warm and dry.  Neurological:      Mental Status: She is alert and oriented to person, place, and time.  Psychiatric:        Behavior: Behavior normal.    ED Results / Procedures / Treatments   Labs (all labs ordered are listed, but only abnormal results are displayed) Labs Reviewed  CBC WITH DIFFERENTIAL/PLATELET - Abnormal; Notable for the following components:      Result Value   RBC 5.72 (*)    MCV 79.9 (*)    All other components within normal limits  COMPREHENSIVE METABOLIC PANEL - Abnormal; Notable for the following components:   Sodium 133 (*)    Glucose, Bld 347 (*)    Total Protein 8.8 (*)    All other components within normal limits  URINALYSIS, ROUTINE W REFLEX MICROSCOPIC - Abnormal; Notable for the following components:   Specific Gravity, Urine 1.042 (*)    Glucose, UA >=500 (*)    Hgb urine dipstick LARGE (*)    Ketones, ur 80 (*)    Leukocytes,Ua SMALL (*)    All other components within normal limits  BETA-HYDROXYBUTYRIC ACID - Abnormal; Notable for the following components:   Beta-Hydroxybutyric Acid 1.15 (*)    All other components within normal limits  HEMOGLOBIN A1C - Abnormal; Notable for the following components:   Hgb A1c MFr Bld 13.3 (*)    All other components within normal limits  CBG MONITORING, ED - Abnormal; Notable for the following components:   Glucose-Capillary 356 (*)    All other components within normal limits  CBG MONITORING, ED - Abnormal; Notable for the following components:   Glucose-Capillary 338 (*)    All other components within normal limits  WET PREP, GENITAL  I-STAT BETA HCG BLOOD, ED ([facility], [facility], [facility] ONLY)  GC/CHLAMYDIA PROBE AMP ([place]) NOT AT [facility]    EKG None  Radiology US Transvaginal Non-OB  Result Date: 08/30/2021 CLINICAL DATA:  Pelvic pain. EXAM: TRANSABDOMINAL AND TRANSVAGINAL ULTRASOUND OF PELVIS TECHNIQUE: Both transabdominal and transvaginal ultrasound examinations of the pelvis were performed. Transabdominal technique was performed for  global imaging of the pelvis including uterus, ovaries, adnexal regions, and pelvic cul-de-sac. It was necessary to proceed with endovaginal exam following the transabdominal exam to visualize the uterus, endometrium, bilateral ovaries and bilateral adnexa. COMPARISON:  None FINDINGS: Uterus Measurements: 7.5 cm x 2.7 cm x 3.9 cm = volume: 41.0 mL. No fibroids or other mass visualized. Endometrium Thickness: 2.1 mm.  No focal abnormality visualized. Right ovary Measurements: 3.7 cm x 1.6 cm x 2.2 cm = volume: 6.7 mL. Normal appearance/no adnexal mass. Left ovary Measurements: 2.6 cm x 1.9 cm x 1.4 cm = volume: 3.5 mL. Normal appearance/no  adnexal mass. Other findings:  No abnormal free fluid. IMPRESSION: Normal pelvic ultrasound. Electronically Signed   By: Aram Candela M.D.   On: 08/30/2021 21:53   US Pelvis Complete  Result Date: 08/30/2021 CLINICAL DATA:  Pelvic pain. EXAM: TRANSABDOMINAL ULTRASOUND OF PELVIS TECHNIQUE: Transabdominal ultrasound examination of the pelvis was performed including evaluation of the uterus, ovaries, adnexal regions, and pelvic cul-de-sac. COMPARISON:  None. FINDINGS: Uterus Measurements: 7.5 cm x 2.7 cm x 3.9 cm = volume: 41.0 mL. No fibroids or other mass visualized. Endometrium Thickness: 2.1 mm.  No focal abnormality visualized. Right ovary Measurements: 3.7 cm x 1.6 cm x 2.2 cm = volume: 6.7 mL. Normal appearance/no adnexal mass. Left ovary Measurements: 2.6 cm x 1.9 cm x 1.4 cm = volume: 3.5 mL. Normal appearance/no adnexal mass. Other findings:  No abnormal free fluid. IMPRESSION: Normal pelvic ultrasound. Electronically Signed   By: Aram Candela M.D.   On: 08/30/2021 21:53    Procedures Procedures   Medications Ordered in ED Medications - No data to display  ED Course  I have reviewed the triage vital signs and the nursing notes.  Pertinent labs & imaging results that were available during my care of the patient were reviewed by me and considered in  my medical decision making (see chart for details).    MDM Rules/Calculators/A&P                           Patient is very tearful about her newly diagnosed Diabetes. She does not have insurance or an outpatient provider. LMP was 2 weeks ago.  Is a 20 year old female with newly diagnosed diabetes suspicious for type I.  Patient's hemoglobin A1c is 13.3 with mildly elevated beta hydroxybutyric acid.  Patient CBC without significant abnormality, CMP shows blood glucose of 347.  Urine shows ketones and hemoglobin.  Negative pregnancy test, negative respiratory panel.  I discussed the case with Dr. Robb Matar who asked that we try to consult with diabetic coordinators.  I spoke with 2 of the diabetic coordinators including Bjorn Loser who is going to consult on the patient.  They feel that given her age, lack of knowledge of diabetic management and due to unknown response to insulin it would be prudent to keep her at least an observation until we have a better understanding of how she will react to insulin as she will clearly need this for management.  I discussed this with Dr. Robb Matar who will admit the patient for observation.  Patient given fluids, subcu insulin 5 units and diabetic diet.  All of these findings were discussed with the patient and her mother at bedside. Final Clinical Impression(s) / ED Diagnoses Final diagnoses:  None    Rx / DC Orders ED Discharge Orders     None        Arthor Captain, PA-C 08/31/21 1542    Vanetta Mulders, MD 08/31/21 1544

## 2021-08-31 NOTE — ED Notes (Signed)
Pt would not allow for cbg at this time. Pt crying and upset. Pt states she just wants to sleep.

## 2021-09-01 ENCOUNTER — Encounter (HOSPITAL_COMMUNITY): Payer: Self-pay | Admitting: Internal Medicine

## 2021-09-01 LAB — GC/CHLAMYDIA PROBE AMP (~~LOC~~) NOT AT ARMC
Chlamydia: NEGATIVE
Comment: NEGATIVE
Comment: NORMAL
Neisseria Gonorrhea: NEGATIVE

## 2021-09-01 LAB — HIV ANTIBODY (ROUTINE TESTING W REFLEX): HIV Screen 4th Generation wRfx: NONREACTIVE

## 2021-09-01 LAB — COMPREHENSIVE METABOLIC PANEL
ALT: 25 U/L (ref 0–44)
AST: 18 U/L (ref 15–41)
Albumin: 3.4 g/dL — ABNORMAL LOW (ref 3.5–5.0)
Alkaline Phosphatase: 59 U/L (ref 38–126)
Anion gap: 7 (ref 5–15)
BUN: 12 mg/dL (ref 6–20)
CO2: 24 mmol/L (ref 22–32)
Calcium: 8.7 mg/dL — ABNORMAL LOW (ref 8.9–10.3)
Chloride: 105 mmol/L (ref 98–111)
Creatinine, Ser: 0.61 mg/dL (ref 0.44–1.00)
GFR, Estimated: >60 mL/min (ref >60)
Glucose, Bld: 277 mg/dL — ABNORMAL HIGH (ref 70–99)
Potassium: 4.1 mmol/L (ref 3.5–5.1)
Sodium: 136 mmol/L (ref 135–145)
Total Bilirubin: 0.4 mg/dL (ref 0.3–1.2)
Total Protein: 7.1 g/dL (ref 6.5–8.1)

## 2021-09-01 LAB — GLUCOSE, CAPILLARY: Glucose-Capillary: 261 mg/dL — ABNORMAL HIGH (ref 70–99)

## 2021-09-01 LAB — CBC
HCT: 40.4 % (ref 36.0–46.0)
Hemoglobin: 13.1 g/dL (ref 12.0–15.0)
MCH: 26 pg (ref 26.0–34.0)
MCHC: 32.4 g/dL (ref 30.0–36.0)
MCV: 80.3 fL (ref 80.0–100.0)
Platelets: 259 10*3/uL (ref 150–400)
RBC: 5.03 MIL/uL (ref 3.87–5.11)
RDW: 12.8 % (ref 11.5–15.5)
WBC: 4.5 10*3/uL (ref 4.0–10.5)
nRBC: 0 % (ref 0.0–0.2)

## 2021-09-01 LAB — MAGNESIUM: Magnesium: 1.9 mg/dL (ref 1.7–2.4)

## 2021-09-01 LAB — CBG MONITORING, ED: Glucose-Capillary: 252 mg/dL — ABNORMAL HIGH (ref 70–99)

## 2021-09-01 MED ORDER — LIVING WELL WITH DIABETES BOOK
Freq: Once | Status: AC
  Filled 2021-09-01: qty 1

## 2021-09-01 MED ORDER — KETOROLAC TROMETHAMINE 30 MG/ML IJ SOLN
30.0000 mg | Freq: Once | INTRAMUSCULAR | Status: AC | PRN
  Administered 2021-09-01: 30 mg via INTRAVENOUS
  Filled 2021-09-01: qty 1

## 2021-09-01 MED ORDER — NOVOLIN 70/30 FLEXPEN RELION (70-30) 100 UNIT/ML ~~LOC~~ SUPN: 12.0000 [IU] | PEN_INJECTOR | Freq: Two times a day (BID) | SUBCUTANEOUS | 11 refills | Status: DC

## 2021-09-01 MED ORDER — CEPHALEXIN 500 MG PO CAPS: 500.0000 mg | ORAL_CAPSULE | Freq: Four times a day (QID) | ORAL | 0 refills | Status: AC

## 2021-09-01 MED ORDER — INSULIN GLARGINE-YFGN 100 UNIT/ML ~~LOC~~ SOLN: 12.0000 [IU] | Freq: Every day | SUBCUTANEOUS | Status: DC

## 2021-09-01 MED ORDER — BLOOD GLUCOSE MONITOR KIT: PACK | 0 refills | Status: DC

## 2021-09-01 MED ORDER — CEPHALEXIN 500 MG PO CAPS
500.0000 mg | ORAL_CAPSULE | Freq: Four times a day (QID) | ORAL | Status: DC
  Administered 2021-09-01: 500 mg via ORAL
  Filled 2021-09-01 (×2): qty 1

## 2021-09-01 MED ORDER — INSULIN STARTER KIT- PEN NEEDLES (ENGLISH)
1.0000 | Freq: Once | Status: AC
  Administered 2021-09-01: 1
  Filled 2021-09-01: qty 1

## 2021-09-01 MED ORDER — INSULIN PEN NEEDLE 30G X 8 MM MISC: 3 refills | Status: DC

## 2021-09-01 MED ORDER — INSULIN GLARGINE-YFGN 100 UNIT/ML ~~LOC~~ SOLN
4.0000 [IU] | Freq: Once | SUBCUTANEOUS | Status: AC
  Administered 2021-09-01: 4 [IU] via SUBCUTANEOUS
  Filled 2021-09-01: qty 0.04

## 2021-09-01 NOTE — ED Notes (Signed)
Waiting for room to be finished cleaning

## 2021-09-01 NOTE — Plan of Care (Signed)
  Problem: Coping: Goal: Level of anxiety will decrease Outcome: Progressing   Problem: Health Behavior/Discharge Planning: Goal: Ability to manage health-related needs will improve Outcome: Progressing   Problem: Clinical Measurements: Goal: Ability to maintain clinical measurements within normal limits will improve Outcome: Progressing Goal: Will remain free from infection Outcome: Progressing Goal: Diagnostic test results will improve Outcome: Progressing Goal: Respiratory complications will improve Outcome: Progressing Goal: Cardiovascular complication will be avoided Outcome: Progressing

## 2021-09-01 NOTE — Discharge Summary (Signed)
[facility]  Physician Discharge Summary   Patient ID: Kristina Sparks MRN: [id number] DOB/AGE: 2001-06-08 20 y.o.  Admit date: 08/30/2021 Discharge date: 09/01/2021    PCP: Gillie Manners, NP  DISCHARGE DIAGNOSES:  Diabetes mellitus, type I, uncontrolled with hyperglycemia, newly diagnosed  RECOMMENDATIONS FOR OUTPATIENT FOLLOW UP: Patient to follow-up with the wellness clinic at her college campus Consider referral to GYN for vaginal bleeding and discomfort   Home Health: None Equipment/Devices: None  CODE STATUS: Full code  DISCHARGE CONDITION: fair  Diet recommendation: Modified carbohydrate  INITIAL HISTORY: 20 y.o. female with medical history significant of of sleep apnea not on CPAP who presented to the emergency department due to vaginal bleeding associated with lower abdominal cramping for the past 3 to 4 days.  She does not use birth control medication.  She has not had similar instances in the past.  She was recently treated for pelvic abscess and due to infection and was noted to have glucosuria and a urinalysis.  She has also been extremely tired with polydipsia and polyuria.  Otherwise denied fever, chills, sore throat, rhinorrhea dyspnea, wheezing or hemoptysis.  No chest pain, dizziness, palpitations, diaphoresis, PND, orthopnea or pitting edema of the lower extremities.  Denied nausea, vomiting, diarrhea, constipation, melena hematochezia.  No dysuria, flank pain, frequency or hematuria.   ED Course: Initial vital signs were temperature 98.1 F pulse 70, respiration 16, BP 108/83 mmHg O2 sat 98%.  He was given IV fluids and 5 units of regular insulin.   Lab work: Her urinalysis showed increase of specific gravity, glucosuria, ketonuria of 80 mg/dL with a large hemoglobin and a small leukocyte esterase.  No bacteria on microscopic examination.  CBC showed white count 6.4, hemoglobin 15.0 g/dL and platelets 322.   HOSPITAL COURSE:   Diabetes mellitus  type I, new onset Patient was seen by diabetes coordinator.  She was given education.  She was taught how to administer insulin.  HbA1c 13.3.  She will be discharged on 70/30 insulin which will be the most affordable for her.  Discussed with patient and her mother.  They will get the prescriptions and glucometer from [place].  They will follow-up with the wellness clinic at her college campus from where further referrals can be made to endocrinology.  They know that they need to monitor her glucose levels closely at home.  They need to maintain a log of the glucose values for outpatient follow-up.   Vaginal bleeding Did not have significant bleeding.  Hemoglobin has been stable.  She underwent pelvic exam.  Negative for yeast trichomonas and clue cells.  Some mention of dysuria with urination.  Small leukocytes noted on UA.  Was discharged with prescription for Keflex.  Was recently prescribed doxycycline.  Previously treated for fungal infection.  Pelvic ultrasound did not show any concerning findings.  Urine pregnancy test was negative.  HIV was negative.  Patient was encouraged to follow-up with GYN.    Patient is stable.  She has been extensively educated regarding her diabetes.  Her mother will provide support.  Okay for discharge today.   PERTINENT LABS:  The results of significant diagnostics from this hospitalization (including imaging, microbiology, ancillary and laboratory) are listed below for reference.    Microbiology: Recent Results (from the past 240 hour(s))  Resp Panel by RT-PCR (Flu A&B, Covid) Nasopharyngeal Swab     Status: None   Collection Time: 08/31/21 10:17 AM   Specimen: Nasopharyngeal Swab; Nasopharyngeal(NP) swabs in vial transport medium  Result Value Ref Range Status   SARS Coronavirus 2 by RT PCR NEGATIVE NEGATIVE Final    Comment: (NOTE) SARS-CoV-2 target nucleic acids are NOT DETECTED.  The SARS-CoV-2 RNA is generally detectable in upper respiratory specimens  during the acute phase of infection. The lowest concentration of SARS-CoV-2 viral copies this assay can detect is 138 copies/mL. A negative result does not preclude SARS-Cov-2 infection and should not be used as the sole basis for treatment or other patient management decisions. A negative result may occur with  improper specimen collection/handling, submission of specimen other than nasopharyngeal swab, presence of viral mutation(s) within the areas targeted by this assay, and inadequate number of viral copies(<138 copies/mL). A negative result must be combined with clinical observations, patient history, and epidemiological information. The expected result is Negative.  Fact Sheet for Patients:  EntrepreneurPulse.com.au  Fact Sheet for Healthcare Providers:  IncredibleEmployment.be  This test is no t yet approved or cleared by the [place] FDA and  has been authorized for detection and/or diagnosis of SARS-CoV-2 by FDA under an Emergency Use Authorization (EUA). This EUA will remain  in effect (meaning this test can be used) for the duration of the COVID-19 declaration under Section 564(b)(1) of the Act, 21 U.S.C.section 360bbb-3(b)(1), unless the authorization is terminated  or revoked sooner.       Influenza A by PCR NEGATIVE NEGATIVE Final   Influenza B by PCR NEGATIVE NEGATIVE Final    Comment: (NOTE) The Xpert Xpress SARS-CoV-2/FLU/RSV plus assay is intended as an aid in the diagnosis of influenza from Nasopharyngeal swab specimens and should not be used as a sole basis for treatment. Nasal washings and aspirates are unacceptable for Xpert Xpress SARS-CoV-2/FLU/RSV testing.  Fact Sheet for Patients: EntrepreneurPulse.com.au  Fact Sheet for Healthcare Providers: IncredibleEmployment.be  This test is not yet approved or cleared by the [place] FDA and has been authorized for detection  and/or diagnosis of SARS-CoV-2 by FDA under an Emergency Use Authorization (EUA). This EUA will remain in effect (meaning this test can be used) for the duration of the COVID-19 declaration under Section 564(b)(1) of the Act, 21 U.S.C. section 360bbb-3(b)(1), unless the authorization is terminated or revoked.  Performed at [facility], [place] [address], [place], [place] [address]   Wet prep, genital     Status: Abnormal   Collection Time: 08/31/21 11:26 AM   Specimen: PATH Cytology Cervicovaginal Ancillary Only  Result Value Ref Range Status   Yeast Wet Prep HPF POC NONE SEEN NONE SEEN Final   Trich, Wet Prep NONE SEEN NONE SEEN Final   Clue Cells Wet Prep HPF POC NONE SEEN NONE SEEN Final   WBC, Wet Prep HPF POC MANY (A) NONE SEEN Final   Sperm NONE SEEN  Final    Comment: Performed at [facility], [place] [address] [place], [place] [address]     Labs:  COVID-19 Labs   Lab Results  Component Value Date   [place] 08/31/2021   [place] NEGATIVE 08/27/2020   [place] NEGATIVE 07/07/2020      Basic Metabolic Panel: Recent Labs  Lab 08/30/21 2143 09/01/21 1100  NA 133* 136  K 3.5 4.1  CL 98 105  CO2 23 24  GLUCOSE 347* 277*  BUN 12 12  CREATININE 0.76 0.61  CALCIUM 9.2 8.7*  MG  --  1.9   Liver Function Tests: Recent Labs  Lab 08/30/21 2143 09/01/21 1100  AST 27 18  ALT 35 25  ALKPHOS 76 59  BILITOT 0.7 0.4  PROT 8.8* 7.1  ALBUMIN 4.4 3.4*    CBC: Recent Labs  Lab 08/30/21 2143 09/01/21 1100  WBC 6.4 4.5  NEUTROABS 2.6  --   HGB 15.0 13.1  HCT 45.7 40.4  MCV 79.9* 80.3  PLT 322 259     CBG: Recent Labs  Lab 08/31/21 1250 08/31/21 1553 08/31/21 1927 09/01/21 0748 09/01/21 1237  GLUCAP 236* 220* 232* 252* 261*     IMAGING STUDIES US Transvaginal Non-OB  Result Date: 08/30/2021 CLINICAL DATA:  Pelvic pain. EXAM: TRANSABDOMINAL AND TRANSVAGINAL ULTRASOUND OF PELVIS TECHNIQUE:  Both transabdominal and transvaginal ultrasound examinations of the pelvis were performed. Transabdominal technique was performed for global imaging of the pelvis including uterus, ovaries, adnexal regions, and pelvic cul-de-sac. It was necessary to proceed with endovaginal exam following the transabdominal exam to visualize the uterus, endometrium, bilateral ovaries and bilateral adnexa. COMPARISON:  None FINDINGS: Uterus Measurements: 7.5 cm x 2.7 cm x 3.9 cm = volume: 41.0 mL. No fibroids or other mass visualized. Endometrium Thickness: 2.1 mm.  No focal abnormality visualized. Right ovary Measurements: 3.7 cm x 1.6 cm x 2.2 cm = volume: 6.7 mL. Normal appearance/no adnexal mass. Left ovary Measurements: 2.6 cm x 1.9 cm x 1.4 cm = volume: 3.5 mL. Normal appearance/no adnexal mass. Other findings:  No abnormal free fluid. IMPRESSION: Normal pelvic ultrasound. Electronically Signed   By: Virgina Norfolk M.D.   On: 08/30/2021 21:53   US Pelvis Complete  Result Date: 08/30/2021 CLINICAL DATA:  Pelvic pain. EXAM: TRANSABDOMINAL ULTRASOUND OF PELVIS TECHNIQUE: Transabdominal ultrasound examination of the pelvis was performed including evaluation of the uterus, ovaries, adnexal regions, and pelvic cul-de-sac. COMPARISON:  None. FINDINGS: Uterus Measurements: 7.5 cm x 2.7 cm x 3.9 cm = volume: 41.0 mL. No fibroids or other mass visualized. Endometrium Thickness: 2.1 mm.  No focal abnormality visualized. Right ovary Measurements: 3.7 cm x 1.6 cm x 2.2 cm = volume: 6.7 mL. Normal appearance/no adnexal mass. Left ovary Measurements: 2.6 cm x 1.9 cm x 1.4 cm = volume: 3.5 mL. Normal appearance/no adnexal mass. Other findings:  No abnormal free fluid. IMPRESSION: Normal pelvic ultrasound. Electronically Signed   By: Virgina Norfolk M.D.   On: 08/30/2021 21:53    DISCHARGE EXAMINATION: Vitals:   09/01/21 0915 09/01/21 0930 09/01/21 1000 09/01/21 1228  BP: 112/70  100/71 119/76  Pulse: 75 73 80 80  Resp: 16   18   Temp:    98.2 F (36.8 C)  TempSrc:    Oral  SpO2: 98% 99% 100% 100%   General appearance: Awake alert.  In no distress Resp: Clear to auscultation bilaterally.  Normal effort Cardio: S1-S2 is normal regular.  No S3-S4.  No rubs murmurs or bruit GI: Abdomen is soft.  Nontender nondistended.  Bowel sounds are present normal.  No masses organomegaly   DISPOSITION: Home  Discharge Instructions     Ambulatory referral to Nutrition and Diabetic Education   Complete by: As directed    New-onset DM with HgbA1C of 13.3%   Call MD for:  difficulty breathing, headache or visual disturbances   Complete by: As directed    Call MD for:  extreme fatigue   Complete by: As directed    Call MD for:  persistant dizziness or light-headedness   Complete by: As directed    Call MD for:  persistant nausea and vomiting   Complete by: As directed    Call MD for:  severe uncontrolled pain   Complete by: As directed    Call MD for:  temperature >100.4   Complete by: As directed    Diet - low sodium heart healthy   Complete by: As directed    Discharge instructions   Complete by: As directed    Take your medications as prescribed. Follow instructions provided by the diabetes coordinator regarding diabetes management and insulin use. Be sure to follow up with the providers at the campus wellness center. Ask them about referring you to a Gyn provider for the vaginal spotting.   You were cared for by a hospitalist during your hospital stay. If you have any questions about your discharge medications or the care you received while you were in the hospital after you are discharged, you can call the unit and asked to speak with the hospitalist on call if the hospitalist that took care of you is not available. Once you are discharged, your primary care physician will handle any further medical issues. Please note that NO REFILLS for any discharge medications will be authorized once you are discharged, as it is  imperative that you return to your primary care physician (or establish a relationship with a primary care physician if you do not have one) for your aftercare needs so that they can reassess your need for medications and monitor your lab values. If you do not have a primary care physician, you can call [phone] for a physician referral.   Increase activity slowly   Complete by: As directed           Allergies as of 09/01/2021   No Known Allergies      Medication List     STOP taking these medications    dicyclomine 10 MG capsule Commonly known as: BENTYL   doxycycline 100 MG capsule Commonly known as: VIBRAMYCIN   fluconazole 150 MG tablet Commonly known as: Diflucan       TAKE these medications    blood glucose meter kit and supplies Kit Dispense based on patient and insurance preference. Use up to four times daily as directed.   cephALEXin 500 MG capsule Commonly known as: KEFLEX Take 1 capsule (500 mg total) by mouth every 6 (six) hours for 5 days.   cetirizine 10 MG tablet Commonly known as: ZyrTEC Allergy Take 1 tablet (10 mg total) by mouth daily. What changed:  when to take this reasons to take this   Insulin Pen Needle 30G X 8 MM Misc Commonly known as: NOVOFINE Use as directed to administer insulin   NovoLIN 70/30 Kwikpen (70-30) 100 UNIT/ML KwikPen Generic drug: insulin isophane & regular human KwikPen Inject 12 Units into the skin 2 (two) times daily.          Follow-up [place]. Schedule an appointment as soon as possible for a visit.   Contact information: [place] [address] [place] [address] [phone]                TOTAL DISCHARGE TIME: 51 minutes  [facility] Pager on [email or web address]  09/02/2021, 11:52 AM

## 2021-09-01 NOTE — ED Notes (Signed)
Pt refused morning labs. Nurse is aware

## 2021-09-01 NOTE — Progress Notes (Signed)
Inpatient Diabetes Program Recommendations  AACE/ADA: New Consensus Statement on Inpatient Glycemic Control (2015)  Target Ranges:  Prepandial:   less than 140 mg/dL      Peak postprandial:   less than 180 mg/dL (1-2 hours)      Critically ill patients:  140 - 180 mg/dL   Lab Results  Component Value Date   GLUCAP 261 (H) 09/01/2021   HGBA1C 13.3 (H) 08/30/2021    Review of Glycemic Control  Diabetes history: New onset Outpatient Diabetes medications: None Current orders for Inpatient glycemic control: Semglee 12 units QD, Novolog 0-9 units TID with meals  HgbA1C - 13.3% Needs PCP for new onset, ordered [facility] consult  Inpatient Diabetes Program Recommendations:    Educated patient and Mother on insulin pen use at home. Reviewed contents of insulin flexpen starter kit. Reviewed all steps if insulin pen including attachment of needle, 2-unit air shot, dialing up dose, giving injection, removing needle, disposal of sharps, storage of unused insulin, disposal of insulin etc. Patient able to provide successful return demonstration. Also reviewed troubleshooting with insulin pen. MD to give patient Rxs for insulin pens and insulin pen needles.   Consider Novolin 70/30 insulin pen for discharge. 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  Instructed to monitor blood sugar at least 3-4x/day and take logbook to PCP.  Answered many questions from mother and patient. Pt understands importance of controlling blood sugars to prevent long and short-term complications.  Continue to titrate Semglee and Novolog.  Diabetes Coordinator to f/u in am.  Thank you. Lorenda Peck, RD, LDN, CDE Inpatient Diabetes Coordinator [phone]

## 2021-09-01 NOTE — TOC Transition Note (Addendum)
Transition of Care [facility]) - CM/SW Discharge Note   Patient Details  Name: Kristina Sparks MRN: [id number] Date of Birth: 2000/11/23  [facility] CM/SW Contact:  Bartholome Bill, RN Phone Number: 09/01/2021, 1:52 PM   Clinical Narrative:     Spoke with pt and mother for dc planning. They were informed that the cheapest place to get 70/30 insulin and supplies would be [facility]. There is a glucometer at [employer] for $9. 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 Pt states she will make an appointment for follow up.      Readmission Risk Interventions No flowsheet data found.

## 2021-09-01 NOTE — ED Notes (Addendum)
Pt is calm and has been sleeping.  No 2200 cbg done as pt was very upset and emotional about having finger sticks and wanting to leave therefore I allowed her to sleep through the night. Edited to add mother who is attentive at the bedside was given recliner and blanket for comfort.

## 2021-09-01 NOTE — Plan of Care (Signed)
  Problem: Coping: Goal: Level of anxiety will decrease 09/01/2021 1504 by Collene Gobble, RN Outcome: Adequate for Discharge 09/01/2021 1338 by Collene Gobble, RN Outcome: Progressing   Problem: Health Behavior/Discharge Planning: Goal: Ability to manage health-related needs will improve 09/01/2021 1504 by Collene Gobble, RN Outcome: Adequate for Discharge 09/01/2021 1338 by Collene Gobble, RN Outcome: Progressing   Problem: Clinical Measurements: Goal: Ability to maintain clinical measurements within normal limits will improve 09/01/2021 1504 by Collene Gobble, RN Outcome: Adequate for Discharge 09/01/2021 1338 by Collene Gobble, RN Outcome: Progressing Goal: Will remain free from infection 09/01/2021 1504 by Collene Gobble, RN Outcome: Adequate for Discharge 09/01/2021 1338 by Collene Gobble, RN Outcome: Progressing Goal: Diagnostic test results will improve 09/01/2021 1504 by Collene Gobble, RN Outcome: Adequate for Discharge 09/01/2021 1338 by Collene Gobble, RN Outcome: Progressing Goal: Respiratory complications will improve 09/01/2021 1504 by Collene Gobble, RN Outcome: Adequate for Discharge 09/01/2021 1338 by Collene Gobble, RN Outcome: Progressing Goal: Cardiovascular complication will be avoided 09/01/2021 1504 by Collene Gobble, RN Outcome: Adequate for Discharge 09/01/2021 1338 by Collene Gobble, RN Outcome: Progressing

## 2021-09-02 LAB — TYPE AND SCREEN
ABO/RH(D): O POS
Antibody Screen: NEGATIVE

## 2021-10-25 ENCOUNTER — Ambulatory Visit: Payer: 59 | Attending: Nurse Practitioner | Admitting: Nurse Practitioner

## 2021-10-25 ENCOUNTER — Other Ambulatory Visit: Payer: Self-pay

## 2021-10-25 ENCOUNTER — Encounter: Payer: Self-pay | Admitting: Nurse Practitioner

## 2021-10-25 VITALS — BP 122/81 | HR 85 | Ht 67.0 in | Wt 176.6 lb

## 2021-10-25 DIAGNOSIS — E119 Type 2 diabetes mellitus without complications: Secondary | ICD-10-CM | POA: Diagnosis not present

## 2021-10-25 DIAGNOSIS — F419 Anxiety disorder, unspecified: Secondary | ICD-10-CM | POA: Diagnosis not present

## 2021-10-25 DIAGNOSIS — E1065 Type 1 diabetes mellitus with hyperglycemia: Secondary | ICD-10-CM | POA: Diagnosis not present

## 2021-10-25 LAB — GLUCOSE, POCT (MANUAL RESULT ENTRY): POC Glucose: 138 mg/dl — AB (ref 70–99)

## 2021-10-25 MED ORDER — FREESTYLE LIBRE 2 READER DEVI: 6 refills | Status: DC

## 2021-10-25 MED ORDER — FREESTYLE LIBRE 14 DAY SENSOR MISC: 6 refills | Status: DC

## 2021-10-25 MED ORDER — NOVOLIN 70/30 FLEXPEN RELION (70-30) 100 UNIT/ML ~~LOC~~ SUPN: 12.0000 [IU] | PEN_INJECTOR | Freq: Two times a day (BID) | SUBCUTANEOUS | 11 refills | Status: DC

## 2021-10-25 NOTE — Progress Notes (Signed)
Assessment & Plan:  Kristina Sparks was seen today for new patient (initial visit).  Diagnoses and all orders for this visit:  Type 1 diabetes mellitus with hyperglycemia (HCC) -     POCT glucose (manual entry) -     Continuous Blood Gluc Receiver (FREESTYLE LIBRE 2 READER) DEVI; Use as instructed. Check blood glucose level by fingerstick 4-5 times per day. -     IA-2 Autoantibodies -     Glutamic acid decarboxylase auto abs -     Continuous Blood Gluc Sensor (FREESTYLE LIBRE 14 DAY SENSOR) MISC; Use as instructed. Check blood glucose level by fingerstick 4-5 times per day. -     insulin isophane & regular human KwikPen (NOVOLIN 70/30 KWIKPEN) (70-30) 100 UNIT/ML KwikPen; Inject 12 Units into the skin 2 (two) times daily.   Patient has been counseled on age-appropriate routine health concerns for screening and prevention. These are reviewed and up-to-date. Referrals have been placed accordingly. Immunizations are up-to-date or declined.    Subjective:   Chief Complaint  Patient presents with   New Patient (Initial Visit)   HPI Kristina Sparks 20 y.o. female presents to office today to establish care.  She has a past medical history of Diabetes mellitus type 1, uncomplicated ([place]).   Newly diagnosed 08-30-2021 after being initially evaluated in the ED for vaginal bleeding. UA noted for glucosuria, ketonuria. A1c 13.3. She was discharged home on 70/30 insulin.  Today she has her meter with her. I have ordered her the freestyle libre to promote better adherence in the future. She has been monitoring glucose levels 3-4 times day. Based on meter readings: 7 day average 178   14 day average 180 30 day average 166  Will order insulin antibody tests to determine Type 1 vs Type 2 DM.   She is very tearful today. Declines referral for counselor.   Review of Systems  Constitutional:  Negative for fever, malaise/fatigue and weight loss.  HENT: Negative.  Negative for nosebleeds.   Eyes:  Negative.  Negative for blurred vision, double vision and photophobia.  Respiratory: Negative.  Negative for cough and shortness of breath.   Cardiovascular: Negative.  Negative for chest pain, palpitations and leg swelling.  Gastrointestinal: Negative.  Negative for heartburn, nausea and vomiting.  Musculoskeletal: Negative.  Negative for myalgias.  Neurological: Negative.  Negative for dizziness, focal weakness, seizures and headaches.  Psychiatric/Behavioral: Negative.  Negative for suicidal ideas.    Past Medical History:  Diagnosis Date   Diabetes mellitus type 1, uncomplicated ([place])     History reviewed. No pertinent surgical history.  Family History  Problem Relation Age of Onset   Cancer Paternal Grandmother    Breast cancer Maternal Grandmother    Throat cancer Maternal Grandmother    Crohn's disease Maternal Uncle     Social History Reviewed with no changes to be made today.   Outpatient Medications Prior to Visit  Medication Sig Dispense Refill   blood glucose meter kit and supplies KIT Dispense based on patient and insurance preference. Use up to four times daily as directed. 1 each 0   cetirizine (ZYRTEC ALLERGY) 10 MG tablet Take 1 tablet (10 mg total) by mouth daily. (Patient taking differently: Take 10 mg by mouth daily as needed for allergies.) 14 tablet 0   Insulin Pen Needle (NOVOFINE) 30G X 8 MM MISC Use as directed to administer insulin 10 packet 3   insulin isophane & regular human KwikPen (NOVOLIN 70/30 KWIKPEN) (70-30) 100 UNIT/ML KwikPen  Inject 12 Units into the skin 2 (two) times daily. 15 mL 11   No facility-administered medications prior to visit.    No Known Allergies     Objective:    BP 122/81   Pulse 85   Ht _0  (1.702 m)   Wt 176 lb 9.6 oz (80.1 kg)   SpO2 98%   BMI 27.66 kg/m  Wt Readings from Last 3 Encounters:  10/25/21 176 lb 9.6 oz (80.1 kg) (94 %, Z= 1.55)*  03/25/21 182 lb (82.6 kg) (95 %, Z= 1.68)*  09/17/19 180 lb (81.6 kg)  (96 %, Z= 1.72)*   * Growth percentiles are based on CDC (Girls, 2-20 Years) data.    Physical Exam Vitals and nursing note reviewed.  Constitutional:      Appearance: She is well-developed.  HENT:     Head: Normocephalic and atraumatic.  Cardiovascular:     Rate and Rhythm: Normal rate and regular rhythm.     Heart sounds: Normal heart sounds. No murmur heard.   No friction rub. No gallop.  Pulmonary:     Effort: Pulmonary effort is normal. No tachypnea or respiratory distress.     Breath sounds: Normal breath sounds. No decreased breath sounds, wheezing, rhonchi or rales.  Chest:     Chest wall: No tenderness.  Abdominal:     General: Bowel sounds are normal.     Palpations: Abdomen is soft.  Musculoskeletal:        General: Normal range of motion.     Cervical back: Normal range of motion.  Skin:    General: Skin is warm and dry.  Neurological:     Mental Status: She is alert and oriented to person, place, and time.     Coordination: Coordination normal.  Psychiatric:        Behavior: Behavior normal. Behavior is cooperative.        Thought Content: Thought content normal.        Judgment: Judgment normal.         Patient has been counseled extensively about nutrition and exercise as well as the importance of adherence with medications and regular follow-up. The patient was given clear instructions to go to ER or return to medical center if symptoms don't improve, worsen or new problems develop. The patient verbalized understanding.   Follow-up: Return in 7 weeks (on 12/10/2021).   Gildardo Pounds, FNP-BC [facility] [place], [place]   10/25/2021, 10:15 PM

## 2021-10-25 NOTE — Progress Notes (Signed)
Newly diabetic.

## 2021-11-03 LAB — GLUTAMIC ACID DECARBOXYLASE AUTO ABS: Glutamic Acid Decarb Ab: 5.8 U/mL — ABNORMAL HIGH (ref 0.0–5.0)

## 2021-11-03 LAB — IA-2 AUTOANTIBODIES: IA-2 Autoantibodies: <7.5 U/mL

## 2021-11-04 ENCOUNTER — Other Ambulatory Visit: Payer: Self-pay | Admitting: Nurse Practitioner

## 2021-11-04 DIAGNOSIS — E119 Type 2 diabetes mellitus without complications: Secondary | ICD-10-CM

## 2021-12-20 ENCOUNTER — Ambulatory Visit: Payer: 59 | Admitting: Nurse Practitioner

## 2022-06-25 IMAGING — US US TRANSVAGINAL NON-OB
1 series · 15 of 25 positions shown · non-contrast
Comparison: None

CLINICAL DATA: Pelvic pain.

EXAM:
TRANSABDOMINAL AND TRANSVAGINAL ULTRASOUND OF PELVIS
TECHNIQUE: Both transabdominal and transvaginal ultrasound examinations of the
pelvis were performed. Transabdominal technique was performed for
global imaging of the pelvis including uterus, ovaries, adnexal
regions, and pelvic cul-de-sac. It was necessary to proceed with
endovaginal exam following the transabdominal exam to visualize the
uterus, endometrium, bilateral ovaries and bilateral adnexa.

[Series 1: us pelvis complete mc & wl · 15 of 76 slices shown]
[im 1/76]
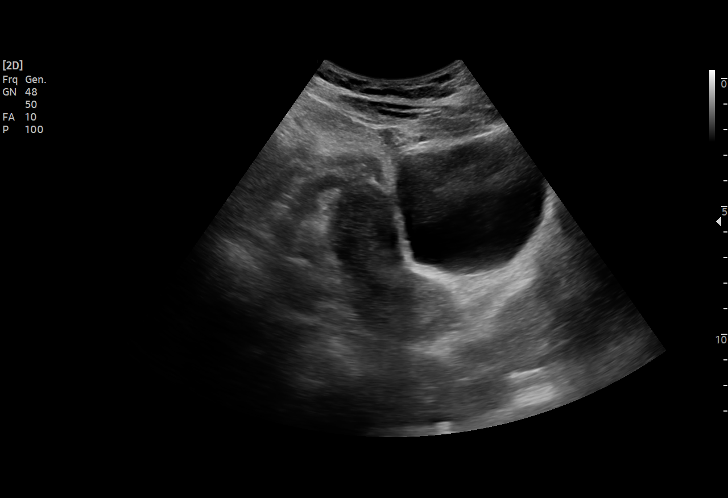
[im 7/76]
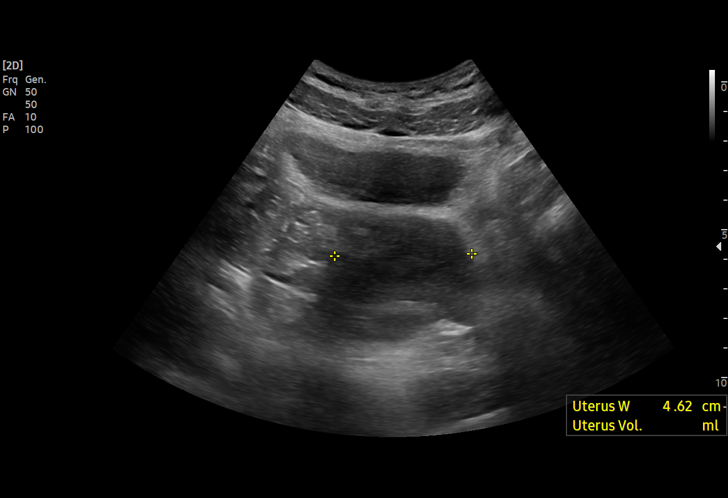
[im 13/76]
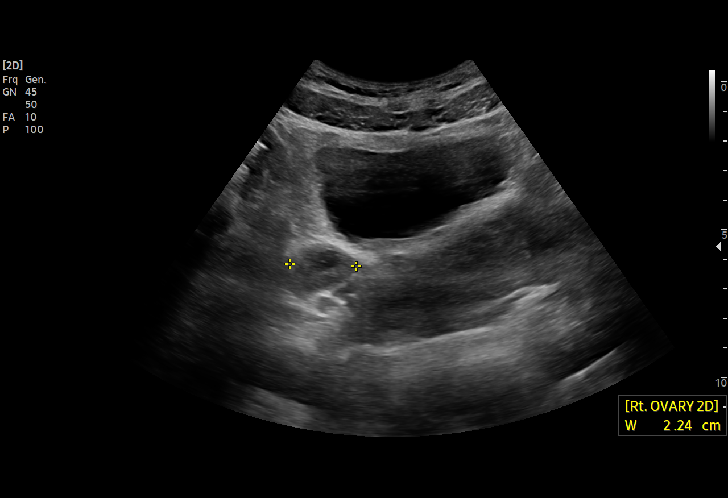
[im 16/76]
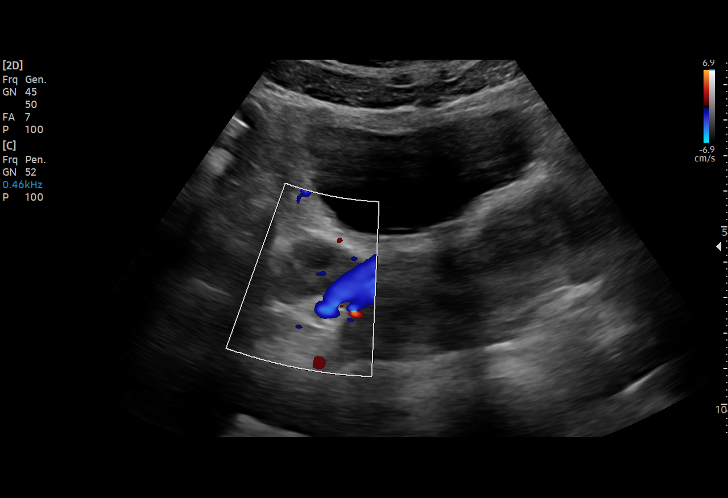
[im 22/76]
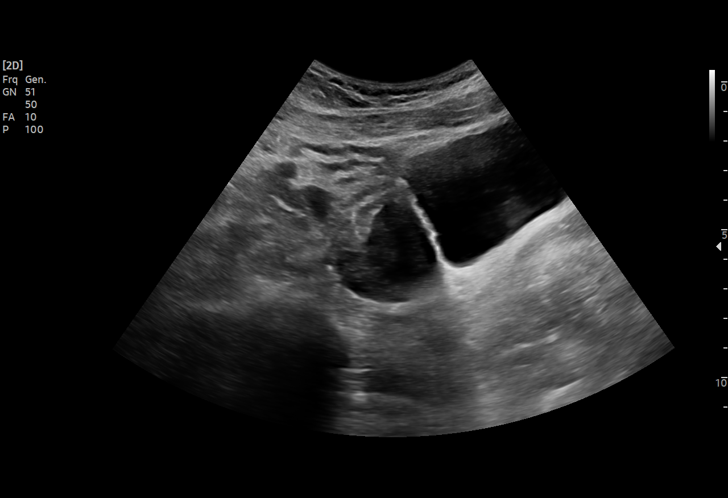
[im 29/76]
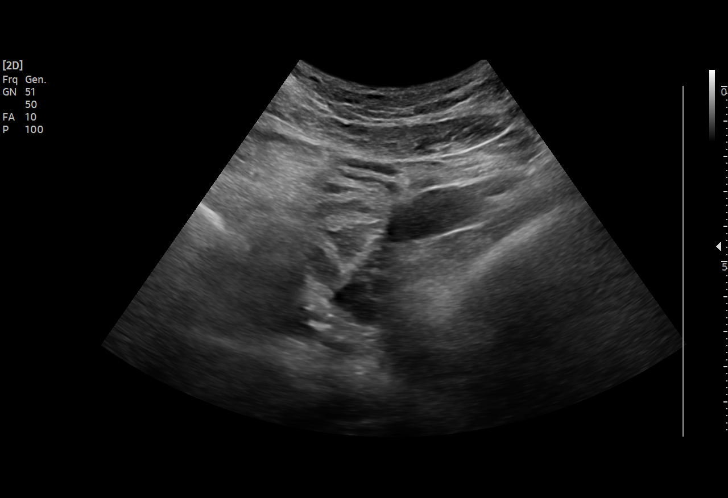
[im 32/76]
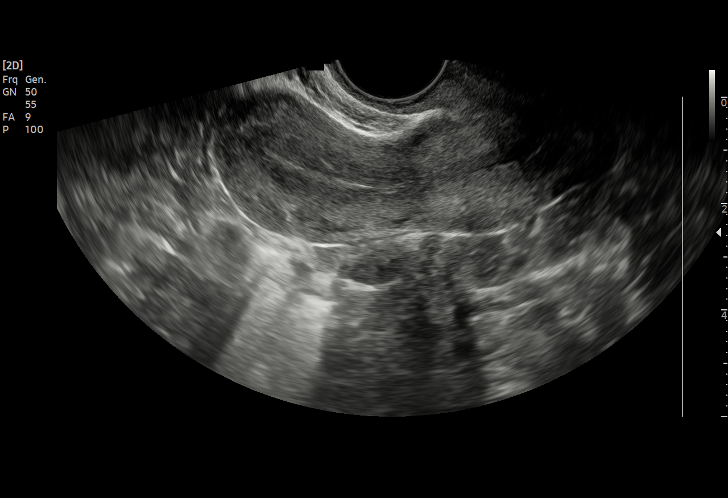
[im 38/76]
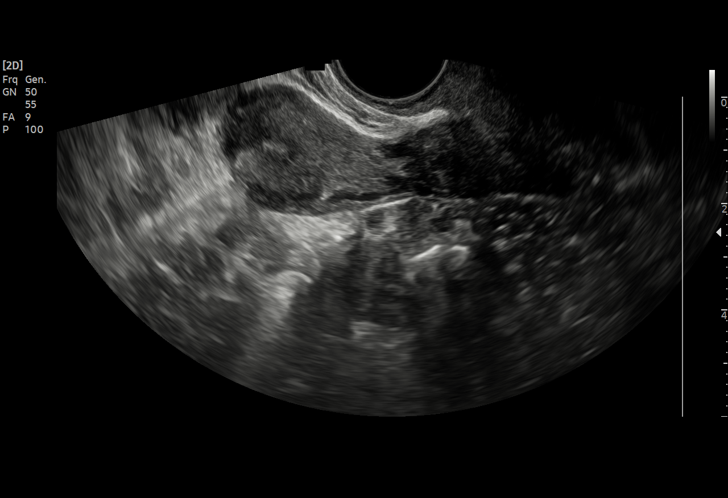
[im 44/76]
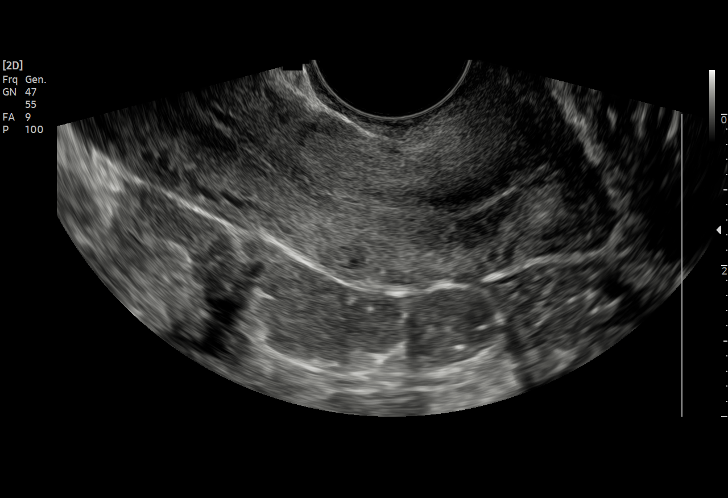
[im 47/76]
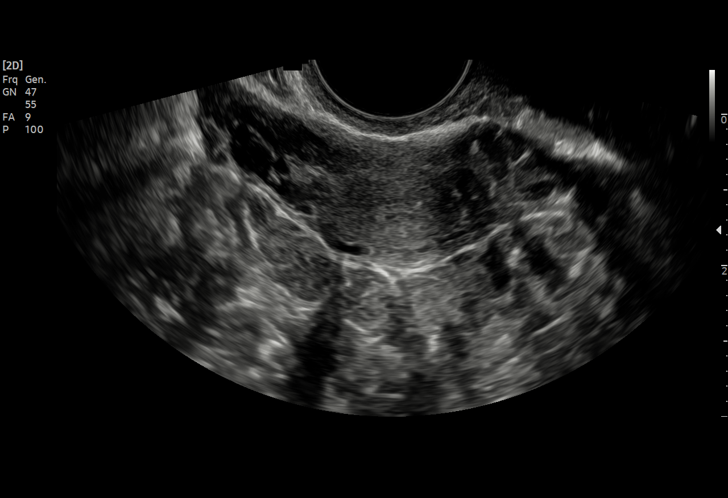
[im 54/76]
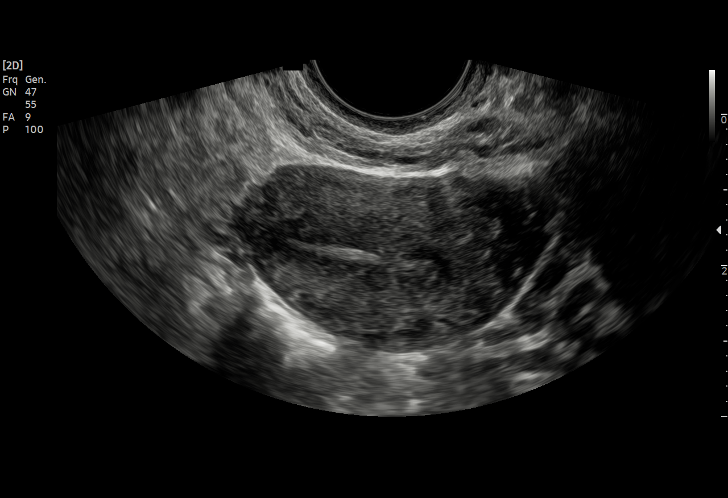
[im 60/76]
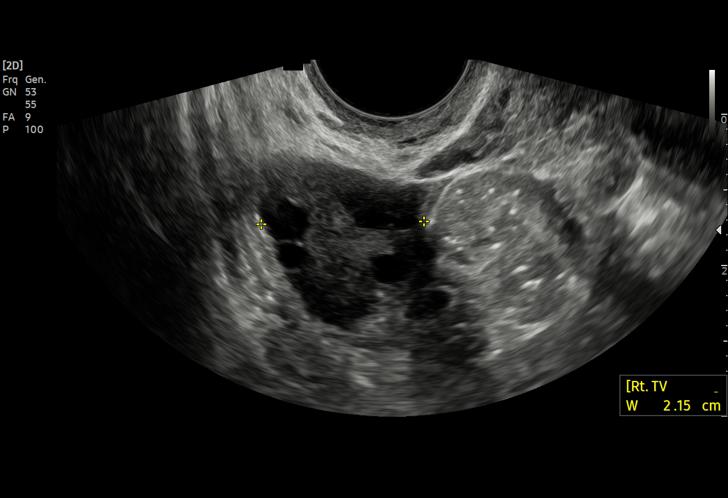
[im 63/76]
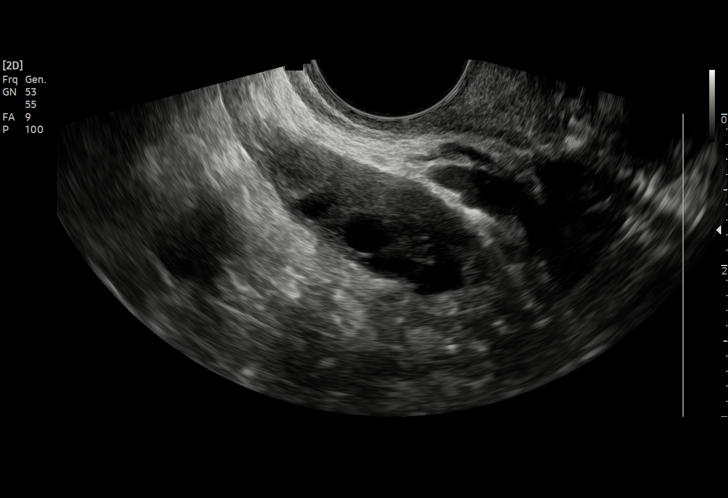
[im 69/76]
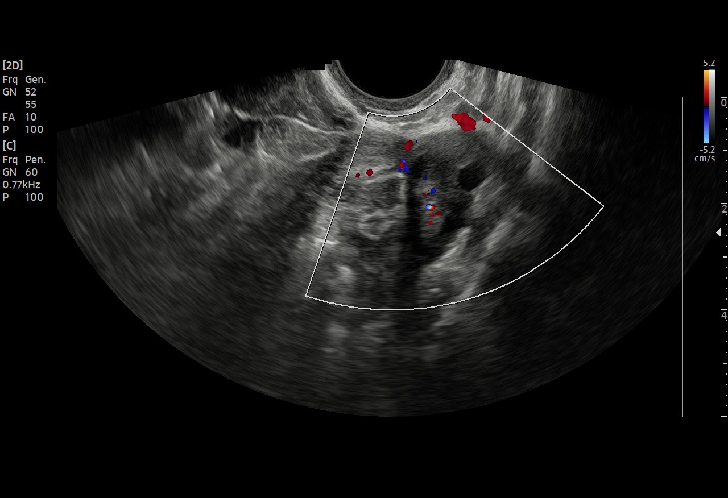
[im 76/76]
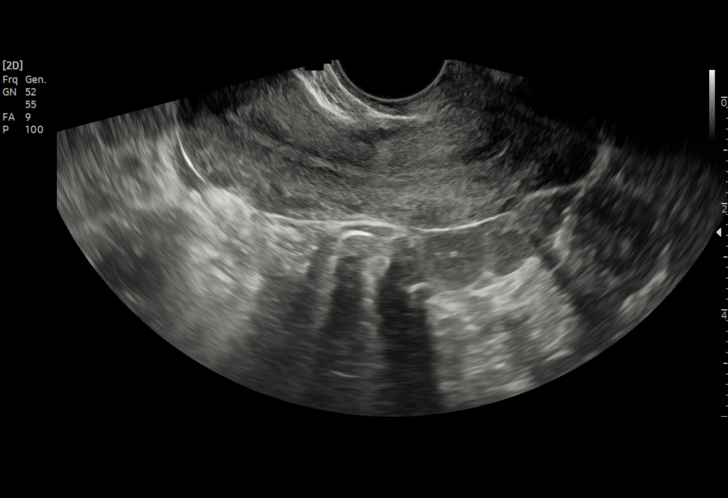

[15 of 25 positions shown; findings below may reference images not displayed]

FINDINGS: Uterus

Measurements: 7.5 cm x 2.7 cm x 3.9 cm = volume: 41.0 mL. No
fibroids or other mass visualized.

Endometrium

Thickness: 2.1 mm.  No focal abnormality visualized.

Right ovary

Measurements: 3.7 cm x 1.6 cm x 2.2 cm = volume: 6.7 mL. Normal
appearance/no adnexal mass.

Left ovary

Measurements: 2.6 cm x 1.9 cm x 1.4 cm = volume: 3.5 mL. Normal
appearance/no adnexal mass.

Other findings:  No abnormal free fluid.
IMPRESSION: Normal pelvic ultrasound.

## 2022-06-25 IMAGING — US US PELVIS COMPLETE
1 series · 15 of 25 positions shown · non-contrast
Comparison: None.

CLINICAL DATA: Pelvic pain.

EXAM:
TRANSABDOMINAL ULTRASOUND OF PELVIS
TECHNIQUE: Transabdominal ultrasound examination of the pelvis was performed
including evaluation of the uterus, ovaries, adnexal regions, and
pelvic cul-de-sac.

[Series 1: us pelvis complete mc & wl · 15 of 76 slices shown]
[im 1/76]
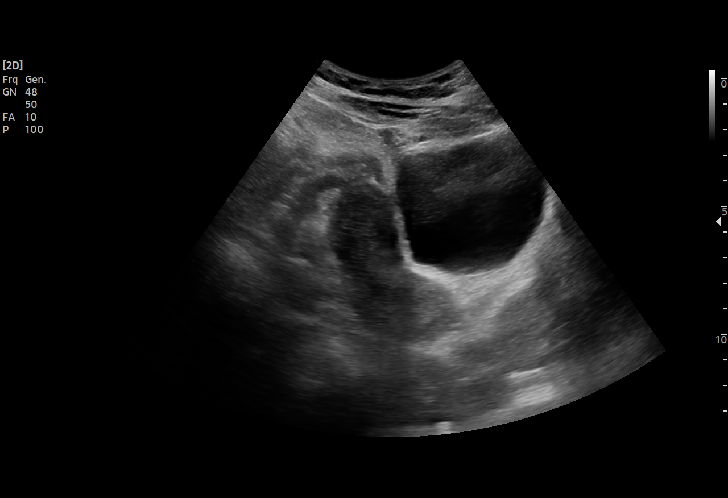
[im 7/76]
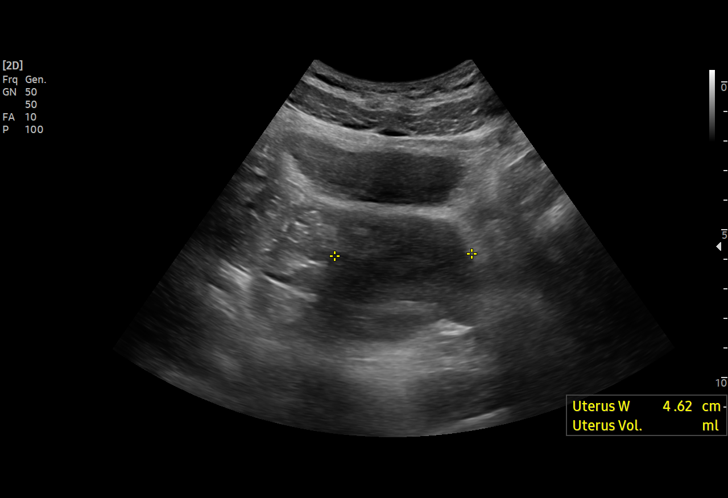
[im 13/76]
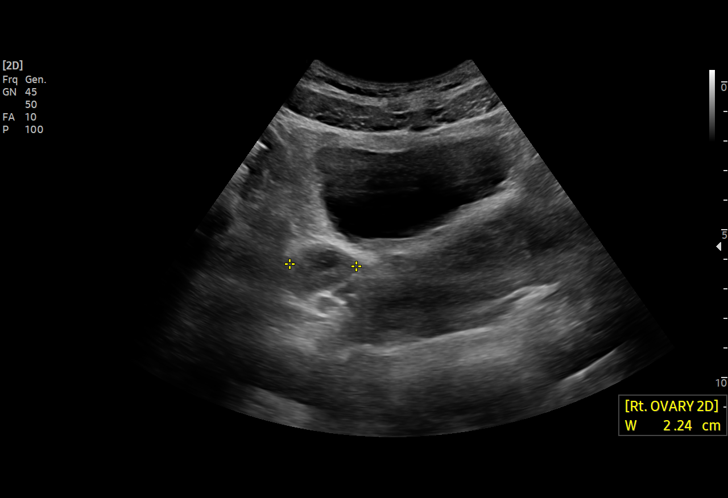
[im 16/76]
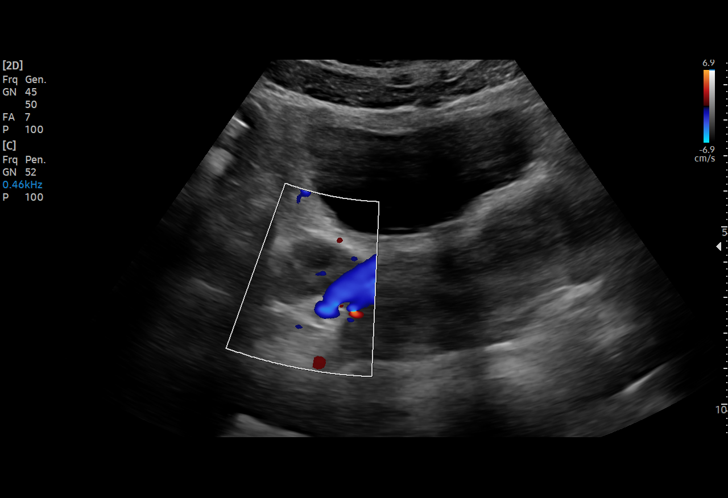
[im 22/76]
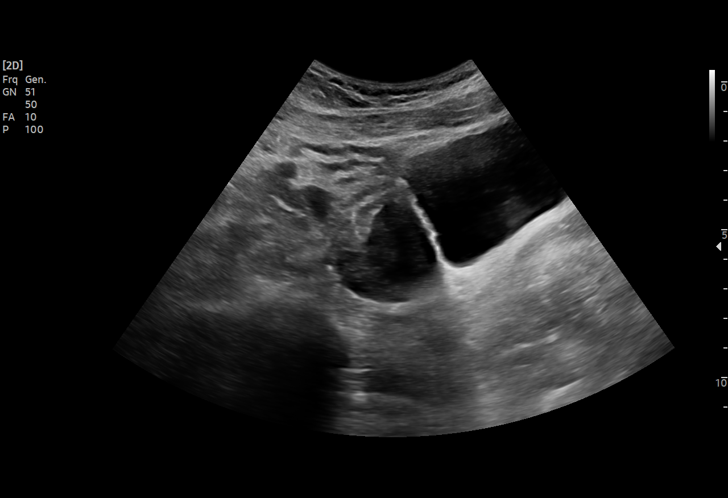
[im 29/76]
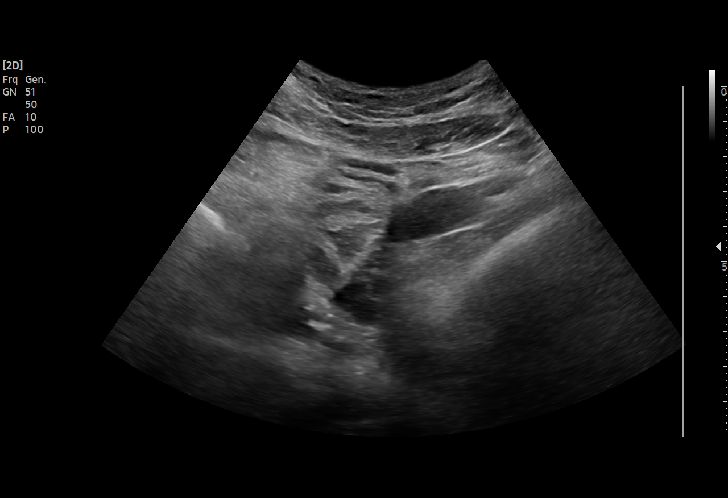
[im 32/76]
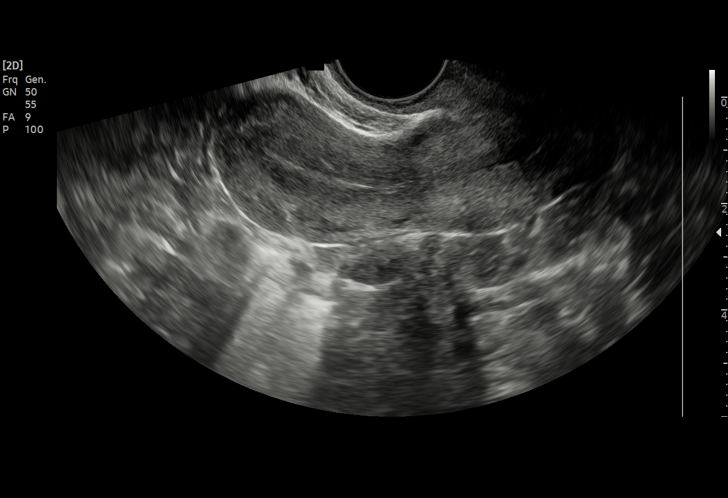
[im 38/76]
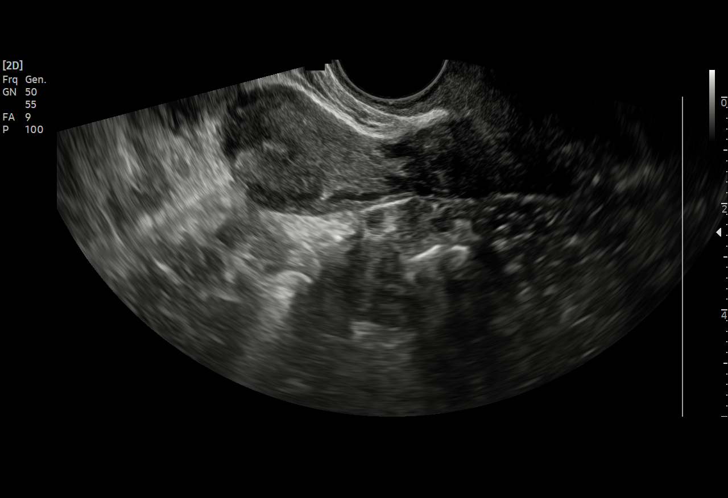
[im 44/76]
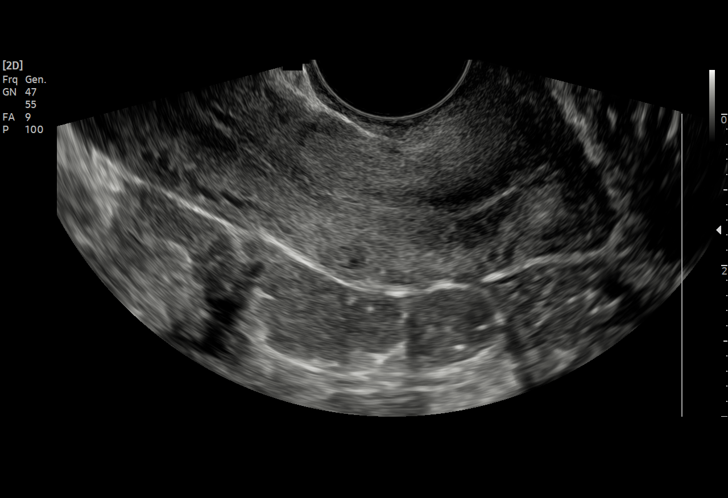
[im 47/76]
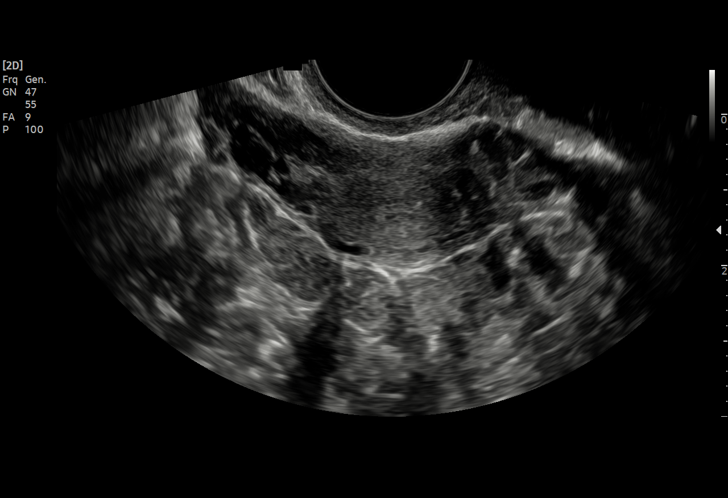
[im 54/76]
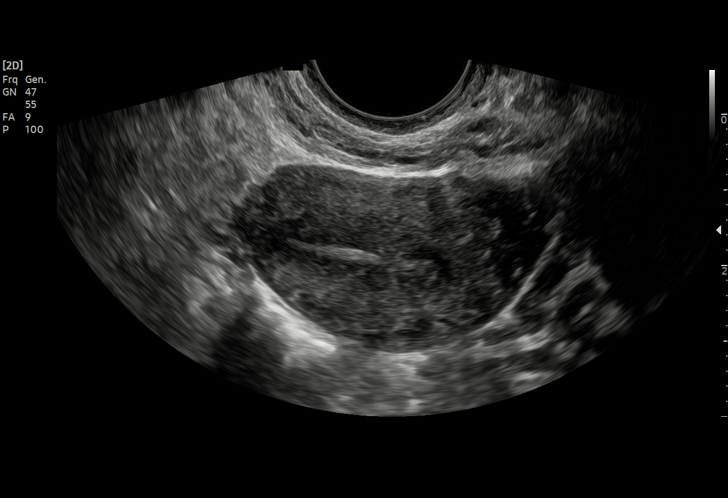
[im 60/76]
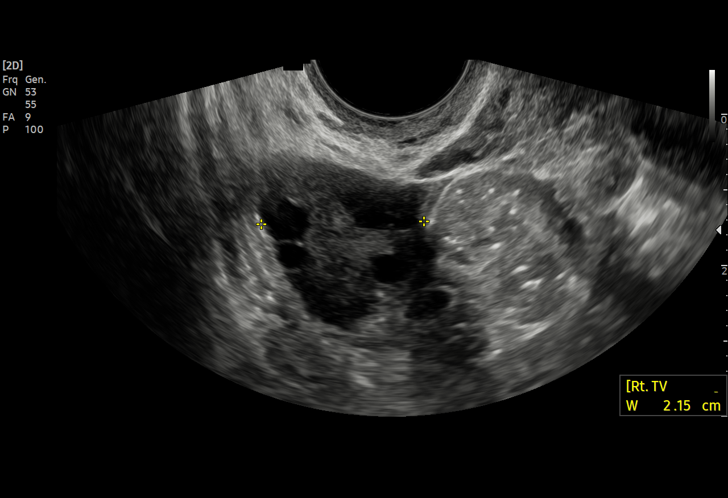
[im 63/76]
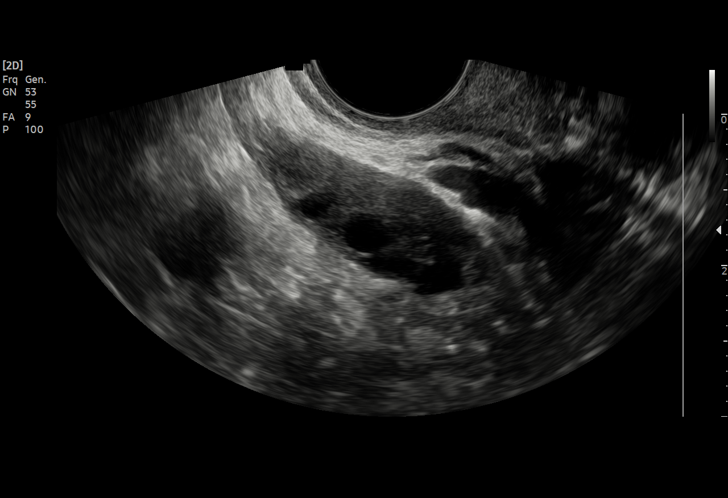
[im 69/76]
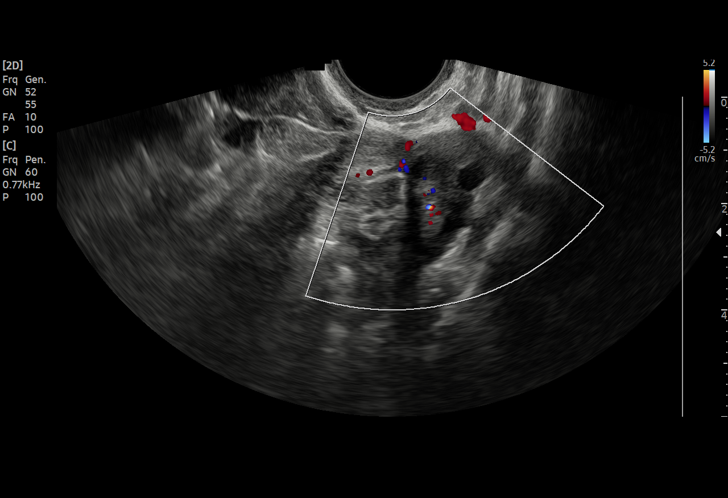
[im 76/76]
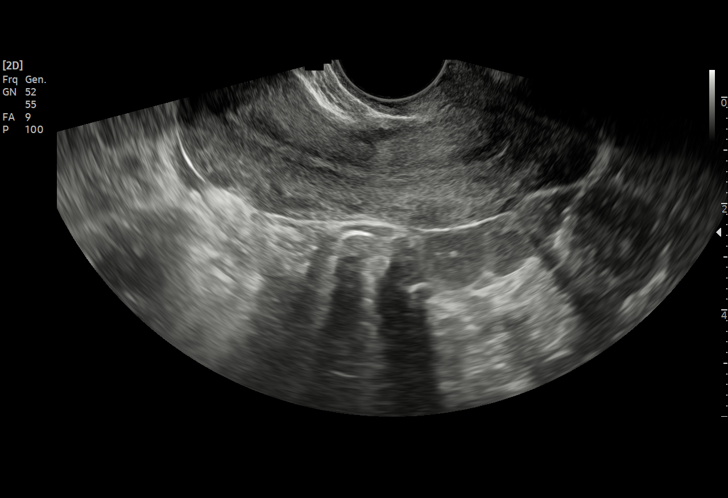

[15 of 25 positions shown; findings below may reference images not displayed]

FINDINGS: Uterus

Measurements: 7.5 cm x 2.7 cm x 3.9 cm = volume: 41.0 mL. No
fibroids or other mass visualized.

Endometrium

Thickness: 2.1 mm.  No focal abnormality visualized.

Right ovary

Measurements: 3.7 cm x 1.6 cm x 2.2 cm = volume: 6.7 mL. Normal
appearance/no adnexal mass.

Left ovary

Measurements: 2.6 cm x 1.9 cm x 1.4 cm = volume: 3.5 mL. Normal
appearance/no adnexal mass.

Other findings:  No abnormal free fluid.
IMPRESSION: Normal pelvic ultrasound.

## 2022-07-14 DIAGNOSIS — N921 Excessive and frequent menstruation with irregular cycle: Secondary | ICD-10-CM | POA: Diagnosis not present

## 2022-07-14 DIAGNOSIS — N898 Other specified noninflammatory disorders of vagina: Secondary | ICD-10-CM | POA: Diagnosis not present

## 2022-07-14 DIAGNOSIS — E109 Type 1 diabetes mellitus without complications: Secondary | ICD-10-CM | POA: Diagnosis not present

## 2022-07-20 DIAGNOSIS — E109 Type 1 diabetes mellitus without complications: Secondary | ICD-10-CM | POA: Diagnosis not present

## 2022-07-22 ENCOUNTER — Encounter: Payer: Self-pay | Admitting: Nurse Practitioner

## 2022-07-25 ENCOUNTER — Other Ambulatory Visit: Payer: Self-pay

## 2022-07-25 ENCOUNTER — Ambulatory Visit (INDEPENDENT_AMBULATORY_CARE_PROVIDER_SITE_OTHER): Payer: BC Managed Care – PPO | Admitting: Family Medicine

## 2022-07-25 ENCOUNTER — Encounter: Payer: Self-pay | Admitting: Family Medicine

## 2022-07-25 VITALS — BP 106/75 | HR 89 | Temp 97.9°F | Resp 18 | Ht 65.0 in | Wt 160.0 lb

## 2022-07-25 DIAGNOSIS — E1065 Type 1 diabetes mellitus with hyperglycemia: Secondary | ICD-10-CM

## 2022-07-25 NOTE — Progress Notes (Signed)
[address] [place], [place] [address] Phone: [phone] Fax: [phone]   Office Visit Note  Patient Name: Kristina Sparks  Date of [id number]  Med Rec number [id number]  Date of Service: 07/25/2022  Patient has no known allergies.  Chief Complaint  Patient presents with   Other        Was diagnosed with diabetes a year ago but had issues with insurance and switching from her pediatrician to a primary care provider so has not yet been seen by endocrinology or a diabetes educator/nutritionist Was initially prescribed Novolin 70/30 12 units two times daily and she has not chnaged her dose. Has not been checking her sugars recently since she has a problem with her meter - past sugars showed her lowest at 126, most higher than that.  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) told her to come here - she also has diabetes so is  good resource for Kristina Sparks  Is under a lot of stress - mother currently in [place] for 2 months - residential program She is the oldest - has half-siblings, father is in [place] Also having some relationship problems Academically having issues with foggy brain Energy levels fluctuate, gets some odd pains in hands and legs Is not always regular about meals  Does have insulin currently and has a mini-fridge in her room on [facility]           Current Medication:  Outpatient Encounter Medications as of 07/25/2022  Medication Sig   cetirizine (ZYRTEC ALLERGY) 10 MG tablet Take 1 tablet (10 mg total) by mouth daily. (Patient taking differently: Take 10 mg by mouth daily as needed for allergies.)   BLISOVI FE 1/20 1-20 MG-MCG tablet Take 1 tablet by mouth daily.   blood glucose meter kit and  supplies KIT Dispense based on patient and insurance preference. Use up to four times daily as directed.   Continuous Blood Gluc Receiver (FREESTYLE LIBRE 2 READER) DEVI Use as instructed. Check blood glucose level by fingerstick 4-5 times per day. (Patient not taking: Reported on 07/25/2022)   Continuous Blood Gluc Sensor (FREESTYLE LIBRE 14 DAY SENSOR) MISC Use as instructed. Check blood glucose level by fingerstick 4-5 times per day. (Patient not taking: Reported on 07/25/2022)   insulin isophane & regular human KwikPen (NOVOLIN 70/30 KWIKPEN) (70-30) 100 UNIT/ML KwikPen Inject 12 Units into the skin 2 (two) times daily.   Insulin Pen Needle (NOVOFINE) 30G X 8 MM MISC Use as directed to administer insulin (Patient not taking: Reported on 07/25/2022)   No facility-administered encounter medications on file as of 07/25/2022.      Medical History: Past Medical History:  Diagnosis Date   Diabetes mellitus type 1, uncomplicated (HCC)      Vital Signs: BP 106/75   Pulse 89   Temp 97.9 F (36.6 C) (Tympanic)   Resp 18   Ht 5' 5" (1.651 m)   Wt 160 lb (72.6 kg)   SpO2 99%   BMI 26.63 kg/m    Review of Systems  Constitutional:  Positive for fatigue.  Endocrine: Positive for polydipsia and polyuria.  Psychiatric/Behavioral:  Positive for decreased concentration. Negative for sleep disturbance.     Physical Exam Vitals reviewed.  Constitutional:  Appearance: Normal appearance.  HENT:     Mouth/Throat:     Mouth: Mucous membranes are dry.  Neurological:     Mental Status: She is alert.  Psychiatric:        Mood and Affect: Mood normal.        Behavior: Behavior normal.        Thought Content: Thought content normal.        Judgment: Judgment normal.       Assessment/Plan:  1. Type 1 diabetes mellitus with hyperglycemia ([place]) For now, Kristina Sparks will continue her 70/30 insulin 12 units two times daily She will try to eat three meals a day Referrals sent as "urgent"  - Amb  ref to Medical Nutrition Therapy-MNT - Ambulatory referral to Endocrinology  If we are unable to get an urgent appointment I will see Kristina Sparks again next week and do baseline labs - Vitamin D, TSH, CBC with diff, as well as urine for microalbumin      General Counseling: Kristina Sparks verbalizes understanding of the findings of todays visit and agrees with plan of treatment. I have discussed any further diagnostic evaluation that may be needed or ordered today. We also reviewed her medications today. she has been encouraged to call the office with any questions or concerns that should arise related to todays visit.   No orders of the defined types were placed in this encounter.   No orders of the defined types were placed in this encounter.   Time spent:45 Minutes   Dr Evette Doffing Reiner Loewen ABFM University Physician

## 2022-07-26 ENCOUNTER — Encounter: Payer: Self-pay | Admitting: Family Medicine

## 2022-07-26 NOTE — Progress Notes (Unsigned)
Name: Kristina Sparks  MRN/ DOB: [id number], Mar 28, 2001   Age/ Sex: 21 y.o., female    PCP: Pcp, No   Reason for Endocrinology Evaluation: Type 1 Diabetes Mellitus     Date of Initial Endocrinology Visit: 07/27/2022     PATIENT IDENTIFIER: Ms. Kristina Sparks is a 21 y.o. female with a past medical history of T1DM. The patient presented for initial endocrinology clinic visit on 07/27/2022 for consultative assistance with her diabetes management.    HPI: Kristina Sparks is accompanied by her cousin    Diagnosed with DM in 08/2021 GAD-65 elevated at 5.8 U/Ml 10/25/2021 Currently checking blood sugars occasionally  .  Hypoglycemia episodes : yes               Symptoms: yes                 Frequency: rare Hemoglobin A1c has ranged from 12.2% in 2023, peaking at 13.3% in 2022. Patient has required hospitalization within the last 1 year from hyper or hypoglycemia: yes 08/30/2021 with DKA   In terms of diet, the patient 2 meals a day. Snacks 1-2 a day. Avoids sugar- sweetened beverages    No FH of DM    HOME DIABETES REGIMEN: Novolin 70/30 at 12 units BID    Statin: no ACE-I/ARB: no    METER DOWNLOAD SUMMARY: Did not bring    DIABETIC COMPLICATIONS: Microvascular complications:   Denies:  Last eye exam: Completed   Macrovascular complications:   Denies: CAD, PVD, CVA   PAST HISTORY: Past Medical History:  Past Medical History:  Diagnosis Date   Diabetes mellitus type 1, uncomplicated ([place])    Past Surgical History: No past surgical history on file.  Social History:  reports that she has never smoked. She has never used smokeless tobacco. She reports current drug use. Drug: Marijuana. She reports that she does not drink alcohol. Family History:  Family History  Problem Relation Age of Onset   Cancer Paternal Grandmother    Breast cancer Maternal Grandmother    Throat cancer Maternal Grandmother    Crohn's disease Maternal Uncle      HOME  MEDICATIONS: Allergies as of 07/27/2022   No Known Allergies      Medication List        Accurate as of July 27, 2022  8:55 AM. If you have any questions, ask your nurse or doctor.          STOP taking these medications    FreeStyle Libre 2 Reader Kerrin Mo Stopped by: Dorita Sciara, MD   NovoLIN 70/30 Kwikpen (70-30) 100 UNIT/ML KwikPen Generic drug: insulin isophane & regular human KwikPen Stopped by: Dorita Sciara, MD       TAKE these medications    Blisovi FE 1/20 1-20 MG-MCG tablet Generic drug: norethindrone-ethinyl estradiol-FE Take 1 tablet by mouth daily.   blood glucose meter kit and supplies Kit Dispense based on patient and insurance preference. Use up to four times daily as directed.   cetirizine 10 MG tablet Commonly known as: ZyrTEC Allergy Take 1 tablet (10 mg total) by mouth daily. What changed:  when to take this reasons to take this   Dexcom G7 Sensor Misc 1 Device by Does not apply route as directed. What changed:  how much to take how to take this when to take this additional instructions Changed by: Dorita Sciara, MD   Gvoke HypoPen 2-Pack 0.5 MG/0.1ML Soaj Generic drug: Glucagon Inject 0.5 mg into  the skin once for 1 dose. Started by: Dorita Sciara, MD   insulin aspart protamine - aspart (70-30) 100 UNIT/ML FlexPen Commonly known as: NOVOLOG 70/30 MIX Inject 16 Units into the skin 2 (two) times daily. Started by: Dorita Sciara, MD   Insulin Pen Needle 32G X 4 MM Misc 1 Device by Does not apply route in the morning and at bedtime. What changed:  medication strength how much to take how to take this when to take this additional instructions Changed by: Dorita Sciara, MD         ALLERGIES: No Known Allergies   REVIEW OF SYSTEMS: A comprehensive ROS was conducted with the patient and is negative except as per HPI and below:  ROS    OBJECTIVE:   VITAL SIGNS: BP 122/80 (BP  Location: Left Arm, Patient Position: Sitting, Cuff Size: Small)   Pulse (!) 102   Ht _0  (1.651 m)   Wt 155 lb 6.4 oz (70.5 kg)   SpO2 98%   BMI 25.86 kg/m    PHYSICAL EXAM:  General: Pt appears well and is in NAD  Neck: General: Supple without adenopathy or carotid bruits. Thyroid: Thyroid size normal.  No goiter or nodules appreciated.   Lungs: Clear with good BS bilat with no rales, rhonchi, or wheezes  Heart: RRR   Abdomen:  soft, nontender  Extremities:  Lower extremities - No pretibial edema. No lesions.  Skin: Normal texture and temperature to palpation. No rash noted.  Neuro: MS is good with appropriate affect, pt is alert and Ox3    DM foot exam:    DATA REVIEWED:  Lab Results  Component Value Date   HGBA1C 13.3 (H) 08/30/2021   07/20/2022  A1c 12.2% BUN 13 Cr 0.740 GFR 119  ASSESSMENT / PLAN / RECOMMENDATIONS:   1) Type 1 Diabetes Mellitus, Poorly controlled, Without complications - Most recent A1c of 12.2 %. Goal A1c < 7.0 %.      -I have discussed with the patient the pathophysiology of diabetes. We went over the natural progression of the disease. We talked about both insulin resistance and insulin deficiency. I explained the complications associated with diabetes including retinopathy, nephropathy, neuropathy as well as increased risk of cardiovascular disease. We went over the benefit seen with glycemic control.   - I explained to the patient that diabetic patients are at higher than normal risk for amputations.  - We also discussed the difference between long and short acting insulin as well as difference between insulin analogues - This was very overwhelming for the pt  - Dexcom G7 sent , she was also given a sample of G7 sensors - Preconception counseling done, pt advised to avoid pregnancy unless A1c 7.0% or less, she is already on OCP's  - Not interested in pump technology at this time  - We discussed in an ideal situation would change to  prandial/basal but given how overwhelmed she was, I opted to keep this for another discussion - We also discussed insulin sensitivity in T1DM and hypoglycemia, a prescription for Gvoke was sent and I have demonstrated proper use of pen to the family member today   MEDICATIONS: Increase Novolog Mix to 16 units before Breakfast and 16 units with Supper   EDUCATION / INSTRUCTIONS: BG monitoring instructions: Patient is instructed to check her blood sugars 3 times a day, before each meal  Call [facility] if: BG persistently < 70  I reviewed the Rule of 15  for the treatment of hypoglycemia in detail with the patient. Literature supplied.   2) Diabetic complications:  Eye: Does not have known diabetic retinopathy.  Neuro/ Feet: Does not have known diabetic peripheral neuropathy. Renal: Patient does not  have known baseline CKD. She is not on an ACEI/ARB at present.    F/U in 3 months      Signed electronically by: Mack Guise, MD  [facility]  [address] [place], [place] [address] Phone: [phone] FAX: [phone]   CC: Pcp, No No address on file Phone: None  Fax: None    Return to Endocrinology clinic as below: Future Appointments  Date Time Provider [place]  07/28/2022  2:45 PM Clydell Hakim, RD [facility]  11/16/2022  8:10 AM Tawyna Pellot, Melanie Crazier, MD [facility] None

## 2022-07-27 ENCOUNTER — Encounter: Payer: Self-pay | Admitting: Internal Medicine

## 2022-07-27 ENCOUNTER — Ambulatory Visit (INDEPENDENT_AMBULATORY_CARE_PROVIDER_SITE_OTHER): Payer: BC Managed Care – PPO | Admitting: Internal Medicine

## 2022-07-27 VITALS — BP 122/80 | HR 102 | Ht 65.0 in | Wt 155.4 lb

## 2022-07-27 DIAGNOSIS — E1065 Type 1 diabetes mellitus with hyperglycemia: Secondary | ICD-10-CM | POA: Diagnosis not present

## 2022-07-27 LAB — POCT GLUCOSE (DEVICE FOR HOME USE): POC Glucose: 450 mg/dl — AB (ref 70–99)

## 2022-07-27 MED ORDER — GVOKE HYPOPEN 2-PACK 0.5 MG/0.1ML ~~LOC~~ SOAJ: 0.5000 mg | Freq: Once | SUBCUTANEOUS | 1 refills | Status: AC

## 2022-07-27 MED ORDER — INSULIN PEN NEEDLE 32G X 4 MM MISC: 1.0000 | Freq: Two times a day (BID) | 3 refills | Status: DC

## 2022-07-27 MED ORDER — DEXCOM G7 SENSOR MISC: 1.0000 | 3 refills | Status: DC

## 2022-07-27 MED ORDER — INSULIN ASPART PROT & ASPART (70-30 MIX) 100 UNIT/ML PEN: 16.0000 [IU] | PEN_INJECTOR | Freq: Two times a day (BID) | SUBCUTANEOUS | 3 refills | Status: DC

## 2022-07-27 NOTE — Patient Instructions (Addendum)
Take Novolog Mix 16 units before Breakfast and 16 units before Supper     HOW TO TREAT LOW BLOOD SUGARS (Blood sugar LESS THAN 70 MG/DL) Please follow the RULE OF 15 for the treatment of hypoglycemia treatment (when your (blood sugars are less than 70 mg/dL)   STEP 1: Take 15 grams of carbohydrates when your blood sugar is low, which includes:  3-4 GLUCOSE TABS  OR 3-4 OZ OF JUICE OR REGULAR SODA OR ONE TUBE OF GLUCOSE GEL    STEP 2: RECHECK blood sugar in 15 MINUTES STEP 3: If your blood sugar is still low at the 15 minute recheck --> then, go back to STEP 1 and treat AGAIN with another 15 grams of carbohydrates.

## 2022-07-28 ENCOUNTER — Encounter: Payer: Self-pay | Admitting: Dietician

## 2022-07-28 ENCOUNTER — Encounter: Payer: BC Managed Care – PPO | Attending: Family Medicine | Admitting: Dietician

## 2022-07-28 DIAGNOSIS — E1065 Type 1 diabetes mellitus with hyperglycemia: Secondary | ICD-10-CM | POA: Diagnosis not present

## 2022-07-28 NOTE — Patient Instructions (Signed)
Take your medication as prescribed "grip in" to do what is hard and in the end it will be easier.  Start to read labels. Become aware of what foods have carbs and spread them through the day. Be aware of your Dexcom readings

## 2022-07-28 NOTE — Progress Notes (Signed)
Diabetes Self-Management Education  Visit Type: First/Initial  Appt. Start Time: 1445 Appt. End Time: 1630  07/28/2022  Ms. Kristina Sparks, identified by name and date of birth, is a 21 y.o. female with a diagnosis of Diabetes: Type 1.   ASSESSMENT Patient is here today with her cousin.  Her mother is out of town and asked that there cousin attend.  She was diagnosed with type 1 diabetes in October 2022. She would like to learn better habits for eating.  She is on a meal plan at [facility] and is a Doctor, hospital (unlimited swipes but class schedule does not always allow easy access).  She has a mini refrigerator in her room. She would also like to learn how to use the Dexcom CGM.  We watched the [facility] training video.  Patient became concerned about placing the Dexcom and refused training.  She is also worried about the sensor showing.  She verbalized wishing she could just be "normal".  She is not checking her blood glucose.  After some discussion about the Dexcom, Shatana's decision process and placement sites, Arrielle agreed and placed the Dexcom on her left outer thigh.  She is aware that this is an alternate placement site.  Sensor was working prior to her leaving.  Sensor reading was in the mid 200's.  Discussed arrows and what they mean.  Discussed that she should do a finger prick if sensor readings do not match her symptoms.  She was told about the Dexcom link.  She entered information to share her glucose results with her mother and cousin.  She noted that the Dexcom was painless and will help her feel more safe. She was added to the [employer] for [employer] and an invite request was sent to her email  She was seen in the office yesterday by an endocrinologist. States that she sometimes doesn't take her insulin when she is overwhelmed.  She has accountability partners at school and with her friends who help with this. She is to begin counseling for depression and stress.  Added  patient to the Type 1 Diabetes support group.  History includes:  Type 1 Diabetes Medications include:  Novolin 70/30 16 units q am and hs (not taking this consistently) Labs noted to include:  A1C 12.2% 08/20/2022 decreased from 13.3% 08/30/2021 Beta hydroxybutyric Acid 1.15 08/2023, GAD 5.8 11/04/2021  Weight hx: 66" 155 lbs 07/27/2022 162 lbs 08/2021 Unintentional weight loss due to diabetes. Skips meals when too stressed (decreased appetite).   She lives in the dorm (alone) at [facility].  She mostly stays with friends rather than alone.  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 She states that she has depression.  Sees a care mentor and is in the process of getting a counseling referral. She is not working at this time.  Height 5\' 6"  (1.676 m), weight 155 lb (70.3 kg). Body mass index is 25.02 kg/m.   Diabetes Self-Management Education - 07/28/22 1604       Visit Information   Visit Type First/Initial      Initial Visit   Diabetes Type Type 1    Date Diagnosed 08/2021    Are you currently following a meal plan? No    Are you taking your medications as prescribed? No      Psychosocial Assessment   Patient Belief/Attitude about Diabetes Afraid   stressed   What is the hardest part about your diabetes right now, causing you the  most concern, or is the most worrisome to you about your diabetes?   Taking/obtaining medications;Checking blood sugar    Self-care barriers Other (comment)   stress, depression   Self-management support Friends;Doctor's office;Family;CDE visits    Other persons present Patient;Family Member    Patient Concerns Nutrition/Meal planning    Special Needs None    Preferred Learning Style No preference indicated    Learning Readiness Ready    How often do you need to have someone help you when you read instructions, pamphlets, or other written materials from your doctor or pharmacy? 1 - Never    What is the last grade level you  completed in school? 2 years college      Pre-Education Assessment   Patient understands the diabetes disease and treatment process. Needs Instruction    Patient understands incorporating nutritional management into lifestyle. Needs Instruction    Patient undertands incorporating physical activity into lifestyle. Needs Instruction    Patient understands using medications safely. Needs Instruction    Patient understands monitoring blood glucose, interpreting and using results Needs Instruction    Patient understands prevention, detection, and treatment of acute complications. Needs Instruction    Patient understands prevention, detection, and treatment of chronic complications. Needs Instruction    Patient understands how to develop strategies to address psychosocial issues. Needs Instruction    Patient understands how to develop strategies to promote health/change behavior. Needs Instruction      Complications   Last HgB A1C per patient/outside source 12.2 %   08/20/2022 decreased from 13.3% 08/2022   How often do you check your blood sugar? Patient declines    Number of hypoglycemic episodes per month 0    Number of hyperglycemic episodes ( >200mg /dL): Daily    Can you tell when your blood sugar is high? No      Patient Education   Previous Diabetes Education No    Disease Pathophysiology Definition of diabetes, type 1 and 2, and the diagnosis of diabetes    Healthy Eating Role of diet in the treatment of diabetes and the relationship between the three main macronutrients and blood glucose level;Food label reading, portion sizes and measuring food.;Plate Method;Carbohydrate counting;Meal options for control of blood glucose level and chronic complications.;Effects of alcohol on blood glucose and safety factors with consumption of alcohol.    Medications Reviewed patients medication for diabetes, action, purpose, timing of dose and side effects.;Taught/reviewed insulin/injectables, injection,  site rotation, insulin/injectables storage and needle disposal.    Monitoring Taught/evaluated CGM (comment);Identified appropriate SMBG and/or A1C goals.    Acute complications Taught prevention, symptoms, and  treatment of hypoglycemia - the 15 rule.;Discussed and identified patients' prevention, symptoms, and treatment of hyperglycemia.;Trained/discussed glucagon administration to patient and designated other.    Diabetes Stress and Support Worked with patient to identify barriers to care and solutions;Role of stress on diabetes;Identified and addressed patients feelings and concerns about diabetes;Brainstormed with patient on coping mechanisms for social situations, getting support from significant others, dealing with feelings about diabetes    Preconception care Role of family planning for patients with diabetes      Post-Education Assessment   Patient understands the diabetes disease and treatment process. Demonstrates understanding / competency    Patient understands incorporating nutritional management into lifestyle. Comprehends key points    Patient undertands incorporating physical activity into lifestyle. Needs Instruction    Patient understands using medications safely. Comphrehends key points    Patient understands monitoring blood glucose, interpreting and using results Comprehends  key points    Patient understands prevention, detection, and treatment of acute complications. Comprehends key points    Patient understands prevention, detection, and treatment of chronic complications. Needs Instruction    Patient understands how to develop strategies to address psychosocial issues. Needs Review    Patient understands how to develop strategies to promote health/change behavior. Needs Review      Outcomes   Expected Outcomes Demonstrated interest in learning. Expect positive outcomes    Future DMSE 2 months    Program Status Not Completed             Individualized Plan for  Diabetes Self-Management Training:   Learning Objective:  Patient will have a greater understanding of diabetes self-management. Patient education plan is to attend individual and/or group sessions per assessed needs and concerns.   Plan:   Patient Instructions  Take your medication as prescribed "grip in" to do what is hard and in the end it will be easier.  Start to read labels. Become aware of what foods have carbs and spread them through the day. Be aware of your Dexcom readings   Expected Outcomes:  Demonstrated interest in learning. Expect positive outcomes  Education material provided: ADA - How to Thrive: A Guide for Your Journey with Diabetes, Food label handouts, Meal plan card, Snack sheet, and Diabetes Resources  If problems or questions, patient to contact team via:  Phone  Future DSME appointment: 2 months

## 2022-08-08 ENCOUNTER — Telehealth: Payer: Self-pay | Admitting: Internal Medicine

## 2022-08-08 NOTE — Telephone Encounter (Signed)
Patient's mother picked up Dexcom G7 samples

## 2022-08-12 DIAGNOSIS — F4321 Adjustment disorder with depressed mood: Secondary | ICD-10-CM | POA: Diagnosis not present

## 2022-08-16 ENCOUNTER — Encounter: Payer: Self-pay | Admitting: *Deleted

## 2022-08-16 ENCOUNTER — Emergency Department (EMERGENCY_DEPARTMENT_HOSPITAL)
Admission: EM | Admit: 2022-08-16 | Discharge: 2022-08-17 | Disposition: A | Discharge status: Other Acute Inpatient Hospital | Payer: BC Managed Care – PPO | Source: Home / Self Care | Attending: Emergency Medicine | Admitting: Emergency Medicine

## 2022-08-16 ENCOUNTER — Other Ambulatory Visit: Payer: Self-pay

## 2022-08-16 DIAGNOSIS — F1290 Cannabis use, unspecified, uncomplicated: Secondary | ICD-10-CM | POA: Diagnosis not present

## 2022-08-16 DIAGNOSIS — F332 Major depressive disorder, recurrent severe without psychotic features: Secondary | ICD-10-CM | POA: Diagnosis not present

## 2022-08-16 DIAGNOSIS — E1065 Type 1 diabetes mellitus with hyperglycemia: Secondary | ICD-10-CM | POA: Insufficient documentation

## 2022-08-16 DIAGNOSIS — Z794 Long term (current) use of insulin: Secondary | ICD-10-CM | POA: Diagnosis not present

## 2022-08-16 DIAGNOSIS — Z808 Family history of malignant neoplasm of other organs or systems: Secondary | ICD-10-CM | POA: Diagnosis not present

## 2022-08-16 DIAGNOSIS — F411 Generalized anxiety disorder: Secondary | ICD-10-CM | POA: Diagnosis not present

## 2022-08-16 DIAGNOSIS — G4700 Insomnia, unspecified: Secondary | ICD-10-CM | POA: Diagnosis not present

## 2022-08-16 DIAGNOSIS — F4323 Adjustment disorder with mixed anxiety and depressed mood: Secondary | ICD-10-CM | POA: Diagnosis not present

## 2022-08-16 DIAGNOSIS — Z91199 Patient's noncompliance with other medical treatment and regimen due to unspecified reason: Secondary | ICD-10-CM | POA: Diagnosis not present

## 2022-08-16 DIAGNOSIS — R45851 Suicidal ideations: Secondary | ICD-10-CM | POA: Diagnosis not present

## 2022-08-16 DIAGNOSIS — F339 Major depressive disorder, recurrent, unspecified: Secondary | ICD-10-CM | POA: Insufficient documentation

## 2022-08-16 DIAGNOSIS — J3089 Other allergic rhinitis: Secondary | ICD-10-CM | POA: Diagnosis not present

## 2022-08-16 DIAGNOSIS — Z1152 Encounter for screening for COVID-19: Secondary | ICD-10-CM | POA: Diagnosis not present

## 2022-08-16 DIAGNOSIS — F329 Major depressive disorder, single episode, unspecified: Secondary | ICD-10-CM | POA: Diagnosis not present

## 2022-08-16 DIAGNOSIS — Z20822 Contact with and (suspected) exposure to covid-19: Secondary | ICD-10-CM | POA: Insufficient documentation

## 2022-08-16 DIAGNOSIS — Z803 Family history of malignant neoplasm of breast: Secondary | ICD-10-CM | POA: Diagnosis not present

## 2022-08-16 LAB — COMPREHENSIVE METABOLIC PANEL
ALT: 18 U/L (ref 0–44)
AST: 18 U/L (ref 15–41)
Albumin: 4.3 g/dL (ref 3.5–5.0)
Alkaline Phosphatase: 56 U/L (ref 38–126)
Anion gap: 11 (ref 5–15)
BUN: 14 mg/dL (ref 6–20)
CO2: 23 mmol/L (ref 22–32)
Calcium: 9.5 mg/dL (ref 8.9–10.3)
Chloride: 105 mmol/L (ref 98–111)
Creatinine, Ser: 0.66 mg/dL (ref 0.44–1.00)
GFR, Estimated: >60 mL/min (ref >60)
Glucose, Bld: 157 mg/dL — ABNORMAL HIGH (ref 70–99)
Potassium: 3.5 mmol/L (ref 3.5–5.1)
Sodium: 139 mmol/L (ref 135–145)
Total Bilirubin: 0.7 mg/dL (ref 0.3–1.2)
Total Protein: 8.7 g/dL — ABNORMAL HIGH (ref 6.5–8.1)

## 2022-08-16 LAB — CBC
HCT: 48 % — ABNORMAL HIGH (ref 36.0–46.0)
Hemoglobin: 15.2 g/dL — ABNORMAL HIGH (ref 12.0–15.0)
MCH: 25.8 pg — ABNORMAL LOW (ref 26.0–34.0)
MCHC: 31.7 g/dL (ref 30.0–36.0)
MCV: 81.4 fL (ref 80.0–100.0)
Platelets: 269 10*3/uL (ref 150–400)
RBC: 5.9 MIL/uL — ABNORMAL HIGH (ref 3.87–5.11)
RDW: 13.2 % (ref 11.5–15.5)
WBC: 5.3 10*3/uL (ref 4.0–10.5)
nRBC: 0 % (ref 0.0–0.2)

## 2022-08-16 LAB — URINE DRUG SCREEN, QUALITATIVE (ARMC ONLY)
Amphetamines, Ur Screen: NOT DETECTED
Barbiturates, Ur Screen: NOT DETECTED
Benzodiazepine, Ur Scrn: NOT DETECTED
Cannabinoid 50 Ng, Ur ~~LOC~~: NOT DETECTED
Cocaine Metabolite,Ur ~~LOC~~: NOT DETECTED
MDMA (Ecstasy)Ur Screen: NOT DETECTED
Methadone Scn, Ur: NOT DETECTED
Opiate, Ur Screen: NOT DETECTED
Phencyclidine (PCP) Ur S: NOT DETECTED
Tricyclic, Ur Screen: NOT DETECTED

## 2022-08-16 LAB — ACETAMINOPHEN LEVEL: Acetaminophen (Tylenol), Serum: <10 ug/mL — ABNORMAL LOW (ref 10–30)

## 2022-08-16 LAB — SALICYLATE LEVEL: Salicylate Lvl: <7 mg/dL — ABNORMAL LOW (ref 7.0–30.0)

## 2022-08-16 LAB — ETHANOL: Alcohol, Ethyl (B): <10 mg/dL (ref <10)

## 2022-08-16 MED ORDER — LORATADINE 10 MG PO TABS
10.0000 mg | ORAL_TABLET | Freq: Every day | ORAL | Status: DC
  Administered 2022-08-17: 10 mg via ORAL
  Filled 2022-08-16: qty 1

## 2022-08-16 MED ORDER — INSULIN ASPART 100 UNIT/ML IJ SOLN
0.0000 [IU] | Freq: Three times a day (TID) | INTRAMUSCULAR | Status: DC
  Administered 2022-08-17: 2 [IU] via SUBCUTANEOUS
  Administered 2022-08-17 (×2): 3 [IU] via SUBCUTANEOUS
  Filled 2022-08-16 (×3): qty 1

## 2022-08-16 MED ORDER — NORETHIN ACE-ETH ESTRAD-FE 1-20 MG-MCG PO TABS: 1.0000 | ORAL_TABLET | Freq: Every day | ORAL | Status: DC

## 2022-08-16 MED ORDER — INSULIN ASPART PROT & ASPART (70-30 MIX) 100 UNIT/ML ~~LOC~~ SUSP
16.0000 [IU] | Freq: Two times a day (BID) | SUBCUTANEOUS | Status: DC
  Administered 2022-08-17 (×2): 16 [IU] via SUBCUTANEOUS
  Filled 2022-08-16 (×2): qty 10

## 2022-08-16 NOTE — BH Assessment (Signed)
Comprehensive Clinical Assessment (CCA) Note  08/16/2022 Kristina Sparks [id number]  Kristina Sparks is a 21 year old female who presents to [facility] ED endorsing suicidal ideations with a plan to overdose on her insulin. Patient was accompanied by her mother, who was present at the bedside during assessment.   Chief complaint "I haven't been doing well this semester and some personal things going on with my family".  On assessment, pt is alert and oriented x4 while clear in her speech and coherent in her thought process. Patient was very tearful during assessment and asking when could she leave the hospital. Patient perseverates on her coursework and fears falling behind in her studies. 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 Patient repeated multiple times that she "cannot miss days out of school" as this will only make her feel worse the more she falls behind.  Kristina Sparks denied sxs of depression, stating "I get sad but it's not like a dark cloud". When asked about anxiety sxs, pt stated "I feel like I have anxiety". Patient was diagnosed with Type 1 Diabetes last Fall (2022). Patient currently denying suicidal ideations/homicidal ideations/AVH. When this writer asked patient if she was connected with an outpatient therapist, pt stated that she has a Regulatory affairs officer that is an alumni" at school who she confides in for support. Patient appears to lack insight and judgement to the severity of her reported suicidal ideations with a plan. Patient did not appear to be responding to internal stimuli while speaking with this Probation officer.   Chief Complaint:  Chief Complaint  Patient presents with   Psychiatric Evaluation   Visit Diagnosis:  Major Depressive Disorder, single episode, severe  Generalized Anxiety Disorder   CCA Screening, Triage and Referral (STR)  Patient Reported Information How did you hear  about [place]? No data recorded Referral name: No data recorded Referral phone number: No data recorded  Whom do you see for routine medical problems? No data recorded Practice/Facility Name: No data recorded Practice/Facility Phone Number: No data recorded Name of Contact: No data recorded Contact Number: No data recorded Contact Fax Number: No data recorded Prescriber Name: No data recorded Prescriber Address (if known): No data recorded  What Is the Reason for Your Visit/Call Today? Suicidal Ideation  How Long Has This Been Causing You Problems? > than 6 months  What Do You Feel Would Help You the Most Today? No data recorded  Have You Recently Been in Any Inpatient Treatment (Hospital/Detox/Crisis Center/28-Day Program)? No data recorded Name/Location of Program/Hospital:No data recorded How Long Were You There? No data recorded When Were You Discharged? No data recorded  Have You Ever Received Services From [facility] Before? No data recorded Who Do You See at [facility]? No data recorded  Have You Recently Had Any Thoughts About Hurting Yourself? Yes  Are You Planning to Commit Suicide/Harm Yourself At This time? No   Have you Recently Had Thoughts About [place]? No  Explanation: No data recorded  Have You Used Any Alcohol or Drugs in the Past 24 Hours? No  How Long Ago Did You Use Drugs or Alcohol? No data recorded What Did You Use and How Much? No data recorded  Do You Currently Have a Therapist/Psychiatrist? No  Name of Therapist/Psychiatrist: No data recorded  Have You Been Recently Discharged From Any Office Practice or Programs? No  Explanation of Discharge From Practice/Program: No data recorded  CCA Screening Triage Referral Assessment Type of Contact: Face-to-Face  Is this Initial or Reassessment? No data recorded Date Telepsych consult ordered in [facility]:  No data recorded Time Telepsych consult ordered in [facility]:  No data  recorded  Patient Reported Information Reviewed? No data recorded Patient Left Without Being Seen? No data recorded Reason for Not Completing Assessment: No data recorded  Collateral Involvement: None   Does Patient Have a Court Appointed Legal Guardian? No data recorded Name and Contact of Legal Guardian: No data recorded If Minor and Not Living with Parent(s), Who has Custody? No data recorded Is CPS involved or ever been involved? Never  Is APS involved or ever been involved? Never   Patient Determined To Be At Risk for Harm To Self or Others Based on Review of Patient Reported Information or Presenting Complaint? No  Method: No data recorded Availability of Means: No data recorded Intent: No data recorded Notification Required: No data recorded Additional Information for Danger to Others Potential: No data recorded Additional Comments for Danger to Others Potential: No data recorded Are There Guns or Other Weapons in Your Home? No data recorded Types of Guns/Weapons: No data recorded Are These Weapons Safely Secured?                            No data recorded Who Could Verify You Are Able To Have These Secured: No data recorded Do You Have any Outstanding Charges, Pending Court Dates, Parole/Probation? No data recorded Contacted To Inform of Risk of Harm To Self or Others: No data recorded  Location of Assessment: [facility] ED   Does Patient Present under Involuntary Commitment? Yes  IVC Papers Initial File Date: 08/16/22   [place] of Residence: [place]   Patient Currently Receiving the Following Services: Not Receiving Services   Determination of Need: Emergent (2 hours)   Options For Referral: Medication Management; Outpatient Therapy; Therapeutic Triage Services     CCA Biopsychosocial Intake/Chief Complaint:  No data recorded Current Symptoms/Problems: No data recorded  Patient Reported Schizophrenia/Schizoaffective Diagnosis in Past: No   Strengths:  Family support  Preferences: No data recorded Abilities: No data recorded  Type of Services Patient Feels are Needed: No data recorded  Initial Clinical Notes/Concerns: No data recorded  Mental Health Symptoms Depression:   None   Duration of Depressive symptoms: No data recorded  Mania:   None   Anxiety:    Worrying; Difficulty concentrating; Irritability   Psychosis:   None   Duration of Psychotic symptoms: No data recorded  Trauma:   None   Obsessions:   None   Compulsions:   None   Inattention:   None   Hyperactivity/Impulsivity:   None   Oppositional/Defiant Behaviors:   None   Emotional Irregularity:   None   Other Mood/Personality Symptoms:   None Reported    Mental Status Exam Appearance and self-care  Stature:   Small   Weight:   Average weight   Clothing:   Neat/clean; Age-appropriate   Grooming:   Well-groomed   Cosmetic use:   None   Posture/gait:   Normal   Motor activity:   Not Remarkable   Sensorium  Attention:   Normal   Concentration:   Normal   Orientation:   X5   Recall/memory:   Normal   Affect and Mood  Affect:   Tearful   Mood:   Depressed   Relating  Eye contact:   Normal  Facial expression:   Responsive   Attitude toward examiner:   Cooperative   Thought and Language  Speech flow:  Clear and Coherent   Thought content:   Appropriate to Mood and Circumstances   Preoccupation:   None   Hallucinations:   None   Organization:  No data recorded  [employer] of Knowledge:   Average   Intelligence:   Above Average   Abstraction:   Normal   Judgement:   Normal   Reality Testing:   Adequate   Insight:   Poor   Decision Making:   Impulsive   Social Functioning  Social Maturity:   Impulsive   Social Judgement:   Normal   Stress  Stressors:   Family conflict; School   Coping Ability:   Deficient supports; Overwhelmed   Skill Deficits:    None   Supports:   Family     Religion: None Reported    Leisure/Recreation: None Reported    Exercise/Diet: Exercise/Diet Have You Gained or Lost A Significant Amount of Weight in the Past Six Months?: No Do You Follow a Special Diet?: No   CCA Employment/Education Employment/Work Situation: Employment / Work Situation Employment Situation: Surveyor, minerals Job has Been Impacted by Current Illness: Yes Describe how Patient's Job has Been Impacted: 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?: No  Education: Education Is Patient Currently Attending School?: Yes School Currently Attending: [employer] Last Grade Completed: 13 (Freshman Year) Did You Attend College?: Yes What Type of College Degree Do you Have?: Patient is currently in school Did You Have An Individualized Education Program (IIEP): No Did You Have Any Difficulty At School?: Yes Were Any Medications Ever Prescribed For These Difficulties?: No Patient's Education Has Been Impacted by Current Illness: Yes How Does Current Illness Impact Education?: Patient reports that her grades have declined and this has caused her a great deal of stress   CCA Family/Childhood History Family and Relationship History: Family history Marital status: Single Does patient have children?: No  Childhood History:     Child/Adolescent Assessment: N/A     CCA Substance Use Alcohol/Drug Use: Alcohol / Drug Use Pain Medications: See MAR Prescriptions: See MAR Over the Counter: See MAR History of alcohol / drug use?: No history of alcohol / drug abuse          Recommendations for Services/Supports/Treatments: Recommendations for Services/Supports/Treatments Recommendations For Services/Supports/Treatments: Medication Management, Individual Therapy  DSM5 Diagnoses: Patient Active Problem List   Diagnosis Date Noted   MDD (major depressive disorder),  recurrent episode, severe (HCC) 08/16/2022   Generalized anxiety disorder 08/16/2022   Type 1 diabetes mellitus with hyperglycemia (HCC) 07/25/2022     Paulina Muchmore Geralyn Flash, Counselor

## 2022-08-16 NOTE — ED Provider Notes (Addendum)
[facility] Provider Note    Event Date/Time   First MD Initiated Contact with Patient 08/16/22 1737     (approximate)   History   Psychiatric Evaluation   HPI  Kristina Sparks is a 21 y.o. female  here with suicidal ideation. Pt pleasant but guarded, somewhat evasive on my questioning. She reportedly was at a park at school today when campus security was notified, and she endorsed to them suicidal ideation with plan to overdose on insulin. She is a type 1 diabetic. No h/o similar issues or hospitalizations in the past. 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 She does not endorse actively giving herself too much insulin.       Physical Exam   Triage Vital Signs: ED Triage Vitals  Enc Vitals Group     BP 08/16/22 1710 (!) 111/96     Pulse Rate 08/16/22 1710 (!) 112     Resp 08/16/22 1710 20     Temp 08/16/22 1710 98.5 F (36.9 C)     Temp Source 08/16/22 1710 Oral     SpO2 08/16/22 1710 100 %     Weight 08/16/22 1702 163 lb (73.9 kg)     Height 08/16/22 1702 5\' 7"  (1.702 m)     Head Circumference --      Peak Flow --      Pain Score --      Pain Loc --      Pain Edu? --      Excl. in [place]? --     Most recent vital signs: Vitals:   08/16/22 1710 08/16/22 2034  BP: (!) 111/96 117/81  Pulse: (!) 112 65  Resp: 20 18  Temp: 98.5 F (36.9 C) 98.6 F (37 C)  SpO2: 100% 99%     General: Awake, no distress.  CV:  Good peripheral perfusion. RRR. No murmurs. Resp:  Normal effort.  Abd:  No distention.  Other:  Cooperative at this time, dysphoric mood.   ED Results / Procedures / Treatments   Labs (all labs ordered are listed, but only abnormal results are displayed) Labs Reviewed  COMPREHENSIVE METABOLIC PANEL - Abnormal; Notable for the following components:      Result Value   Glucose, Bld 157 (*)    Total Protein 8.7 (*)    All other components within normal limits  SALICYLATE LEVEL -  Abnormal; Notable for the following components:   Salicylate Lvl <1.6 (*)    All other components within normal limits  ACETAMINOPHEN LEVEL - Abnormal; Notable for the following components:   Acetaminophen (Tylenol), Serum <10 (*)    All other components within normal limits  CBC - Abnormal; Notable for the following components:   RBC 5.90 (*)    Hemoglobin 15.2 (*)    HCT 48.0 (*)    MCH 25.8 (*)    All other components within normal limits  RESP PANEL BY RT-PCR (FLU A&B, COVID) ARPGX2  ETHANOL  URINE DRUG SCREEN, QUALITATIVE ([facility] ONLY)  POC URINE PREG, ED  POC URINE PREG, ED     EKG    RADIOLOGY    I also independently reviewed and agree with radiologist interpretations.   PROCEDURES:  Critical Care performed: No   MEDICATIONS ORDERED IN ED: Medications  insulin aspart protamine - aspart (NOVOLOG 70/30 MIX) FlexPen 16 Units (has no administration in time range)  loratadine (CLARITIN) tablet 10 mg (has no administration in time  range)  norethindrone-ethinyl estradiol-FE (LOESTRIN FE) 1-20 MG-MCG per tablet 1 tablet (has no administration in time range)  insulin aspart (novoLOG) injection 0-9 Units (has no administration in time range)     IMPRESSION / MDM / ASSESSMENT AND PLAN / ED COURSE  I reviewed the triage vital signs and the nursing notes.                             Ddx:  Differential includes the following, with pertinent life- or limb-threatening emergencies considered:  Depression, acute stress reaction, depression from underlying medical condition  Patient's presentation is most consistent with acute presentation with potential threat to life or bodily function.  MDM:  21 year old female with no significant psychiatric history, type 1 diabetes, here with reported intention to harm herself via insulin overdose in the setting of multiple stressors including significant stress at school.  Patient arrives under police recommendation/escort.  IVC  placed on arrival due to active suicidal ideation with high risk story and need for psychiatric evaluation.  This was placed by me.  Psychiatry asked to evaluate.  See their note for further details.  Psychiatry recommends inpatient admission and continued IVC and treatment at this time.  Patient certainly high risk based on her story and risk factors including no known psychiatric history, access to insulin and general underlying medical condition, significant school stressors.  She does seem to have a significant support structure with her mother.  Patient initially very upset with nursing asking questions when she was in hall. She had been placed in a hall for safety and to facilitate evaluation. On my initial interview I notified her that she would be evaluated by Psychiatry and need to be cleared from a safety perspective. Unfortunately, possible from a miscommunication, she was not aware she would need to remain until she was told by psychiatry, myself and nursing. She became understandably upset with this. I explained that my role here was for safety and that once IVC placed and once Psychiatry has agreed pt needs ongoing care, I cannot and will not rescind the IVC until cleared. She expressed agitation that she was not given a dinner despite eating a [place] sandwhich that was brought to her. She also had a dinner ordered but this was not brought in time as she needed to be cleared.  Pt also saying she is not being treated as no therapy had been done or talk sessions despite her being here "for over 6 hours." I explained the role of emergency psychiatric evaluation and that the priority right now is safety and determining disposition. She then accused me of voluntarily lying to her as well as staff despite this conversation. She states she knows this is not how care should be performed and asked to speak to the supervisor and have "a new care team." She told me directly that she had no respect for me,  nursing, or this facility.  I explained to her again that this is unfortunately the care that is needed and available right now, and that she would have a different ED doc but I could not guarantee her when or who will see her with Psychiatry. She also demanded that she be seen immediately by a psych MD and expressed that she felt I was dismissing her when I had to get called away to an emergent seizing patient.  I've best described the typical course of stay to her and home meds ordered.  MEDICATIONS GIVEN IN ED: Medications  insulin aspart protamine - aspart (NOVOLOG 70/30 MIX) FlexPen 16 Units (has no administration in time range)  loratadine (CLARITIN) tablet 10 mg (has no administration in time range)  norethindrone-ethinyl estradiol-FE (LOESTRIN FE) 1-20 MG-MCG per tablet 1 tablet (has no administration in time range)  insulin aspart (novoLOG) injection 0-9 Units (has no administration in time range)     Consults:  Psychiatry   EMR reviewed       FINAL CLINICAL IMPRESSION(S) / ED DIAGNOSES   Final diagnoses:  Suicidal ideation     Rx / DC Orders   ED Discharge Orders     None        Note:  This document was prepared using Dragon voice recognition software and may include unintentional dictation errors.   Shaune Pollack, MD 08/17/22 Marlyne Beards    Shaune Pollack, MD 08/17/22 [phone]

## 2022-08-16 NOTE — ED Notes (Signed)
Pt eating dinner tray.  Mother in with pt.

## 2022-08-16 NOTE — Consult Note (Signed)
[facility] Face-to-Face Psychiatry Consult   Reason for Consult: Psychiatric Evaluation Referring Physician: Dr. Ellender Hose Patient Identification: Kristina Sparks MRN:  [id number] Principal Diagnosis: <principal problem not specified> Diagnosis:  Active Problems:   Type 1 diabetes mellitus with hyperglycemia ([place])   MDD (major depressive disorder), recurrent episode, severe ([place])   Generalized anxiety disorder   Total Time spent with patient: 1 hour  Subjective:  "I can't stay here. I have too much to do." Kristina Sparks is a 21 y.o. female patient presented to [facility] ED via law enforcement voluntarily, and the EDP placed the patient under involuntary commitment after she voiced that she would overdose on her insulin. The patient arrived at the ED upset and noncompliant with care.  Per the ED triage nurses note, Pt brought in by [place] police.  Pt reports SI.  Pt denies HI.  Pt last took her insulin was yesterday am.  Pt states when she is upset whe will not take her insulin.  Pt denies drugs or etoh use.  Pt calm and cooperative until this rn told pt about blood work and dressing out.  Pt began crying and refused blood and urine.  Pt states she just wants to go home.  Pt taken to 57[address] with police officer.  Pt is Voluntary.   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 The patient shared that she is overwhelmed with everything she has to do. She shared that she is not doing well in school, and being impatient will only push her back further in her studies. The patient is emotional. The patient's mom is at the bedside and wants to know if she can take the patient home. Mom shared that she is not working and she will be home. This Probation officer explained to the patient's mom that her daughter voiced suicidal ideation and planned to overdose on her insulin. This Probation officer shared with the mom that it is concerning which admission  is recommended for the patient at this time. Mom voiced speaking to the EDP. This provider saw The patient face-to-face; the chart was reviewed, and consulted with Dr.Isaacs on 08/16/2022 due to the patient's care. It was discussed with the EDP that the patient does meet the criteria to be admitted to the psychiatric inpatient unit.  On evaluation, the patient is alert and oriented x4, anxious, emotional but cooperative, and mood-congruent with affect. The patient does not appear to be responding to internal or external stimuli. Neither is the patient presenting with any delusional thinking. The patient denies auditory or visual hallucinations. The patient admits to suicidal ideation and plans to overdose on insulin but denies homicidal ideations. The patient is not presenting with any psychotic or paranoid behaviors. During an encounter with the patient, she could answer questions appropriately.  HPI:  Per Dr. Ellender Hose, Kristina Sparks is a 21 y.o. female  here with suicidal ideation. Pt pleasant but guarded, somewhat evasive on my questioning. She reportedly was at a park at school today when campus security was notified, and she endorsed to them suicidal ideation with plan to overdose on insulin. She is a type 1 diabetic. No h/o similar issues or hospitalizations in the past. 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 She does not endorse actively giving herself too much insulin.     Past Psychiatric History: History reviewed. No pertinent past psychiatric  history  Risk to Self:   Risk to Others:   Prior Inpatient Therapy:   Prior Outpatient Therapy:    Past Medical History:  Past Medical History:  Diagnosis Date   Diabetes mellitus type 1, uncomplicated ([place])    No past surgical history on file. Family History:  Family History  Problem Relation Age of Onset   Cancer Paternal Grandmother    Breast cancer Maternal Grandmother    Throat cancer Maternal  Grandmother    Crohn's disease Maternal Uncle    Family Psychiatric  History: Mother-"I take mental health medications." Social History:  Social History   Substance and Sexual Activity  Alcohol Use Yes     Social History   Substance and Sexual Activity  Drug Use Yes   Types: Marijuana    Social History   Socioeconomic History   Marital status: Single    Spouse name: Not on file   Number of children: Not on file   Years of education: Not on file   Highest education level: Not on file  Occupational History   Not on file  Tobacco Use   Smoking status: Never   Smokeless tobacco: Never  Vaping Use   Vaping Use: Never used  Substance and Sexual Activity   Alcohol use: Yes   Drug use: Yes    Types: Marijuana   Sexual activity: Never  Other Topics Concern   Not on file  Social History Narrative   ** Merged History Encounter **       LIves at home with mother.    Social Determinants of Health   Financial Resource Strain: Not on file  Food Insecurity: Not on file  Transportation Needs: Not on file  Physical Activity: Not on file  Stress: Not on file  Social Connections: Not on file   Additional Social History:    Allergies:  No Known Allergies  Labs:  Results for orders placed or performed during the hospital encounter of 08/16/22 (from the past 48 hour(s))  Comprehensive metabolic panel     Status: Abnormal   Collection Time: 08/16/22  8:30 PM  Result Value Ref Range   Sodium 139 135 - 145 mmol/L   Potassium 3.5 3.5 - 5.1 mmol/L   Chloride 105 98 - 111 mmol/L   CO2 23 22 - 32 mmol/L   Glucose, Bld 157 (H) 70 - 99 mg/dL    Comment: Glucose reference range applies only to samples taken after fasting for at least 8 hours.   BUN 14 6 - 20 mg/dL   Creatinine, Ser 0.66 0.44 - 1.00 mg/dL   Calcium 9.5 8.9 - 10.3 mg/dL   Total Protein 8.7 (H) 6.5 - 8.1 g/dL   Albumin 4.3 3.5 - 5.0 g/dL   AST 18 15 - 41 U/L   ALT 18 0 - 44 U/L   Alkaline Phosphatase 56 38 -  126 U/L   Total Bilirubin 0.7 0.3 - 1.2 mg/dL   GFR, Estimated >60 >60 mL/min    Comment: (NOTE) Calculated using the CKD-EPI Creatinine Equation (2021)    Anion gap 11 5 - 15    Comment: Performed at [facility], [address] [place], [place] [address]  Ethanol     Status: None   Collection Time: 08/16/22  8:30 PM  Result Value Ref Range   Alcohol, Ethyl (B) <10 <10 mg/dL    Comment: (NOTE) Lowest detectable limit for serum alcohol is 10 mg/dL.  For medical purposes only. Performed at  [facility], [address], [place], [place] [address]   Salicylate level     Status: Abnormal   Collection Time: 08/16/22  8:30 PM  Result Value Ref Range   Salicylate Lvl <3.2 (L) 7.0 - 30.0 mg/dL    Comment: Performed at [facility], [address] [place], [place] [address]  Acetaminophen level     Status: Abnormal   Collection Time: 08/16/22  8:30 PM  Result Value Ref Range   Acetaminophen (Tylenol), Serum <10 (L) 10 - 30 ug/mL    Comment: (NOTE) Therapeutic concentrations vary significantly. A range of 10-30 ug/mL  may be an effective concentration for many patients. However, some  are best treated at concentrations outside of this range. Acetaminophen concentrations >150 ug/mL at 4 hours after ingestion  and >50 ug/mL at 12 hours after ingestion are often associated with  toxic reactions.  Performed at [facility], [address] [place], [place] [address]   cbc     Status: Abnormal   Collection Time: 08/16/22  8:30 PM  Result Value Ref Range   WBC 5.3 4.0 - 10.5 K/uL   RBC 5.90 (H) 3.87 - 5.11 MIL/uL   Hemoglobin 15.2 (H) 12.0 - 15.0 g/dL   HCT 48.0 (H) 36.0 - 46.0 %   MCV 81.4 80.0 - 100.0 fL   MCH 25.8 (L) 26.0 - 34.0 pg   MCHC 31.7 30.0 - 36.0 g/dL   RDW 13.2 11.5 - 15.5 %   Platelets 269 150 - 400 K/uL   nRBC 0.0 0.0 - 0.2 %    Comment: Performed at [facility], [address], [place], [place] [address]  Urine  Drug Screen, Qualitative     Status: None   Collection Time: 08/16/22  9:00 PM  Result Value Ref Range   Tricyclic, Ur Screen NONE DETECTED NONE DETECTED   Amphetamines, Ur Screen NONE DETECTED NONE DETECTED   MDMA (Ecstasy)Ur Screen NONE DETECTED NONE DETECTED   Cocaine Metabolite,Ur [place] NONE DETECTED NONE DETECTED   Opiate, Ur Screen NONE DETECTED NONE DETECTED   Phencyclidine (PCP) Ur S NONE DETECTED NONE DETECTED   Cannabinoid 50 Ng, Ur [place] NONE DETECTED NONE DETECTED   Barbiturates, Ur Screen NONE DETECTED NONE DETECTED   Benzodiazepine, Ur Scrn NONE DETECTED NONE DETECTED   Methadone Scn, Ur NONE DETECTED NONE DETECTED    Comment: (NOTE) Tricyclics + metabolites, urine    Cutoff 1000 ng/mL Amphetamines + metabolites, urine  Cutoff 1000 ng/mL MDMA (Ecstasy), urine              Cutoff 500 ng/mL Cocaine Metabolite, urine          Cutoff 300 ng/mL Opiate + metabolites, urine        Cutoff 300 ng/mL Phencyclidine (PCP), urine         Cutoff 25 ng/mL Cannabinoid, urine                 Cutoff 50 ng/mL Barbiturates + metabolites, urine  Cutoff 200 ng/mL Benzodiazepine, urine              Cutoff 200 ng/mL Methadone, urine                   Cutoff 300 ng/mL  The urine drug screen provides only a preliminary, unconfirmed analytical test result and should not be used for non-medical purposes. Clinical consideration and professional judgment should be applied to any positive drug screen result due to possible interfering substances.  A more specific alternate chemical method must be used in order to obtain a confirmed analytical result. Gas chromatography / mass spectrometry (GC/MS) is the preferred confirm atory method. Performed at [facility], [address] [place], [place] [address]     No current facility-administered medications for this encounter.   Current Outpatient Medications  Medication Sig Dispense Refill   BLISOVI FE 1/20 1-20 MG-MCG tablet Take 1 tablet by  mouth daily.     blood glucose meter kit and supplies KIT Dispense based on patient and insurance preference. Use up to four times daily as directed. 1 each 0   cetirizine (ZYRTEC ALLERGY) 10 MG tablet Take 1 tablet (10 mg total) by mouth daily. (Patient taking differently: Take 10 mg by mouth daily as needed for allergies.) 14 tablet 0   Continuous Blood Gluc Sensor (DEXCOM G7 SENSOR) MISC 1 Device by Does not apply route as directed. 9 each 3   insulin aspart protamine - aspart (NOVOLOG 70/30 MIX) (70-30) 100 UNIT/ML FlexPen Inject 16 Units into the skin 2 (two) times daily. 30 mL 3   Insulin Pen Needle 32G X 4 MM MISC 1 Device by Does not apply route in the morning and at bedtime. 200 each 3    Musculoskeletal: Strength & Muscle Tone: within normal limits Gait & Station: normal Patient leans: N/A  Psychiatric Specialty Exam:  Presentation  General Appearance: Appropriate for Environment  Eye Contact:Good  Speech:Clear and Coherent  Speech Volume:Decreased  Handedness:Right   Mood and Affect  Mood:Irritable; Anxious; Angry  Affect:Depressed; Constricted; Tearful   Thought Process  Thought Processes:Coherent  Descriptions of Associations:Intact  Orientation:Full (Time, Place and Person)  Thought Content:Logical; Tangential  History of Schizophrenia/Schizoaffective disorder:No data recorded Duration of Psychotic Symptoms:No data recorded Hallucinations:Hallucinations: None  Ideas of Reference:None  Suicidal Thoughts:Suicidal Thoughts: Yes, Active SI Active Intent and/or Plan: With Intent; With Plan; With Means to [place]; With Access to Means  Homicidal Thoughts:Homicidal Thoughts: No   Sensorium  Memory:Immediate Good; Recent Good; Remote Good  Judgment:Poor  Insight:Poor   Executive Functions  Concentration:Fair  Attention Span:Fair  [place]   Psychomotor Activity  Psychomotor  Activity:Psychomotor Activity: Normal   Assets  Assets:Communication Skills; Desire for Improvement; Physical Health; Resilience; Social Support   Sleep  Sleep:Sleep: Good Number of Hours of Sleep: 8   Physical Exam: Physical Exam Vitals and nursing note reviewed. Exam conducted with a chaperone present.  Constitutional:      Appearance: Normal appearance. She is normal weight.  HENT:     Head: Normocephalic and atraumatic.     Right Ear: External ear normal.     Left Ear: External ear normal.     Nose: Nose normal.  Cardiovascular:     Rate and Rhythm: Normal rate.     Pulses: Normal pulses.  Pulmonary:     Effort: Pulmonary effort is normal.  Musculoskeletal:        General: Normal range of motion.     Cervical back: Normal range of motion and neck supple.  Neurological:     General: No focal deficit present.     Mental Status: She is alert and oriented to person, place, and time.  Psychiatric:        Attention and Perception: Attention and perception normal.        Mood and Affect: Mood is anxious and depressed. Affect is angry and tearful.        Speech: Speech is  tangential.        Behavior: Behavior is uncooperative and agitated.        Thought Content: Thought content includes suicidal ideation. Thought content includes suicidal plan.        Cognition and Memory: Cognition and memory normal.        Judgment: Judgment is impulsive.    Review of Systems  Psychiatric/Behavioral:  Positive for depression and suicidal ideas. The patient is nervous/anxious.   All other systems reviewed and are negative.  Blood pressure 117/81, pulse 65, temperature 98.6 F (37 C), temperature source Oral, resp. rate 18, height 5' 7"  (1.702 m), weight 73.9 kg, last menstrual period 07/25/2022, SpO2 99 %. Body mass index is 25.53 kg/m.  Treatment Plan Summary: Plan Patient does meet criteria for psychiatric inpatient admission  Disposition: Recommend psychiatric Inpatient  admission when medically cleared. Supportive therapy provided about ongoing stressors.  Caroline Sauger, NP 08/16/2022 10:01 PM

## 2022-08-16 NOTE — ED Notes (Signed)
Dr Ellender Hose in with pt and mother.

## 2022-08-16 NOTE — ED Triage Notes (Addendum)
Pt brought in by [place] police.  Pt reports SI.  Pt denies HI.  Pt last took her insulin was yesterday am.  Pt states when she is upset whe will not take her insulin.  Pt denies drugs or etoh use.  Pt calm and cooperative until this rn told pt about blood work and dressing out.  Pt began crying and refused blood and urine.  Pt states she just wants to go home.  Pt taken to 53[place] with police officer.  Pt is Voluntary.

## 2022-08-16 NOTE — ED Notes (Signed)
TTS in with pt now.   

## 2022-08-16 NOTE — ED Notes (Signed)
Pregnancy poct Negative

## 2022-08-16 NOTE — ED Notes (Signed)
Pt called over this tech to talk about seeing the doctor. The pt stated "I am not comfortable in this bed" and "I could be sitting at home with my mom like this instead." The pt also said she "wants to speak with the doctor now." Pt stated she "did not get dinner" and also said she "ate her [place] sandwich" which was provided to her. This tech offered to find some food or another meal tray, to which the pt did not accept. This tech also offered more warm blankets or pillows to make her stay more comfortable, to which the pt did not accept. Pt asked RN to come speak with her, so this tech got the RN.

## 2022-08-16 NOTE — ED Notes (Signed)
Pt asked tech again about needing her phone to monitor glucose levels. This tech talked with RN first, then proceeded to inform pt and family member that having the phone in the room was not as option. This tech presented the alternative of manually checking the pts glucose with a glucometer, to which the pt did not provide a clear answer. This tech stayed in the room while the RN came in to talk to pt and family member about the situation the pt is in.  Pt and family member stated they will "wait for the doctor."

## 2022-08-16 NOTE — ED Notes (Signed)
This tech provided the pts family member with a fresh cup of ice water. This tech also obtained permission by the pt, and her family member, to get some blood and a urine sample to send to the lab. The pt expressed a distaste for needles, so this tech made sure to talk and comfort the pt throughout the blood draw. Blood draw was successful and pt stated she wanted to drink more water before providing a urine sample.

## 2022-08-16 NOTE — ED Notes (Signed)
This tech came to offer pt, and the pts family member, a snack. This tech listed options, such as ice-cream. The pt was concerned eating ice-cream would raise her glucose levels. The pt and the pts family member explained the pt wears a dexcom to track her glucose levels. They also explained the pts phone must be close by for the dexcom to monitor the pts glucose. The pt and her family member asked this tech to move the pts phone closer so the pts glucose could be measured. This tech asked security and the RN if this was okay. RN said yes, so this tech labeled the pts phone and charger with the proper white labels, then moved the phone and charger with security outside the pts room.

## 2022-08-16 NOTE — ED Notes (Signed)
Pt refusing blood work or dressing out per [employer], Therapist, sports. Pt reports not wanting to be here. Officer sitting with patient at this time.

## 2022-08-16 NOTE — ED Notes (Signed)
Pt was placed in 19hall due to not allowing Amy, RN to obtain blood or dress out. This RN went to pt bedside to ask patient if it was okay to share information with mother as she was here to see patient. Pt gave verbal permission. This RN then asked patient if she would allow [place] to obtain blood work back here and patient refused stating "I dont want to be here, I just dont see why this is necessary". This RN asked patient is she knew why she was here, and patient reported the officer brought her here. This RN asked if patient had thoughts of hurting self or anyone else and patient states "yes". This RN then asked if patient had a plan, pt reports "yes, I would give myself too much insulin". This RN informed patient that MD would be over shortly and that psych team would be by later to assess her. Pt then becomes upset with this RN stating you cant ask me this stuff in the hall and I dont want to stay. This RN explained that this RN was told patient refused to be dressed out or give blood so unfortunately the only room we had available at the time was in the hallway and apologized for the inconvenience. Pt then becomes more upset and said "you cant do that, I know that & that's a lie I never said I wouldn't dress out, that's a lie". This RN asked if patient would be willing to dress out at this time with this RN and officer at bedside, pt agreeable. Pt dressed out and all belongings sent with mother. Mother currently at bedside with patient, as mother seems to be helping the situation at this time and keeping patient calm. Patient still unhappy with being in the hall and being asked questions. This RN again apologized and explained if patient felt uncomfortable answering questions, she did not have to answer those at any time. Patient then states "ok". Mother remains at bedside- okay per MD and officer at bedside. Mother understanding of rules and cooperative.

## 2022-08-17 ENCOUNTER — Inpatient Hospital Stay
Admission: AD | Admit: 2022-08-17 | Discharge: 2022-08-18 | DRG: 885 | Disposition: A | Discharge status: Home / Self Care | Payer: BC Managed Care – PPO | Source: Intra-hospital | Attending: Psychiatry | Admitting: Psychiatry

## 2022-08-17 ENCOUNTER — Encounter: Payer: Self-pay | Admitting: Psychiatry

## 2022-08-17 ENCOUNTER — Other Ambulatory Visit: Payer: Self-pay

## 2022-08-17 DIAGNOSIS — Z808 Family history of malignant neoplasm of other organs or systems: Secondary | ICD-10-CM

## 2022-08-17 DIAGNOSIS — Z794 Long term (current) use of insulin: Secondary | ICD-10-CM | POA: Diagnosis not present

## 2022-08-17 DIAGNOSIS — Z803 Family history of malignant neoplasm of breast: Secondary | ICD-10-CM | POA: Diagnosis not present

## 2022-08-17 DIAGNOSIS — G4700 Insomnia, unspecified: Secondary | ICD-10-CM | POA: Diagnosis present

## 2022-08-17 DIAGNOSIS — F411 Generalized anxiety disorder: Secondary | ICD-10-CM | POA: Diagnosis present

## 2022-08-17 DIAGNOSIS — Z1152 Encounter for screening for COVID-19: Secondary | ICD-10-CM

## 2022-08-17 DIAGNOSIS — F332 Major depressive disorder, recurrent severe without psychotic features: Secondary | ICD-10-CM | POA: Diagnosis present

## 2022-08-17 DIAGNOSIS — J3089 Other allergic rhinitis: Secondary | ICD-10-CM | POA: Diagnosis present

## 2022-08-17 DIAGNOSIS — R45851 Suicidal ideations: Secondary | ICD-10-CM | POA: Diagnosis present

## 2022-08-17 DIAGNOSIS — F1290 Cannabis use, unspecified, uncomplicated: Secondary | ICD-10-CM | POA: Diagnosis present

## 2022-08-17 DIAGNOSIS — Z91199 Patient's noncompliance with other medical treatment and regimen due to unspecified reason: Secondary | ICD-10-CM

## 2022-08-17 DIAGNOSIS — F329 Major depressive disorder, single episode, unspecified: Principal | ICD-10-CM | POA: Diagnosis present

## 2022-08-17 DIAGNOSIS — E1065 Type 1 diabetes mellitus with hyperglycemia: Secondary | ICD-10-CM | POA: Diagnosis present

## 2022-08-17 DIAGNOSIS — F4323 Adjustment disorder with mixed anxiety and depressed mood: Secondary | ICD-10-CM | POA: Diagnosis present

## 2022-08-17 LAB — RESP PANEL BY RT-PCR (FLU A&B, COVID) ARPGX2
Influenza A by PCR: NEGATIVE
Influenza B by PCR: NEGATIVE
SARS Coronavirus 2 by RT PCR: NEGATIVE

## 2022-08-17 LAB — POC URINE PREG, ED
Preg Test, Ur: NEGATIVE
Preg Test, Ur: NEGATIVE

## 2022-08-17 MED ORDER — HYDROXYZINE HCL 25 MG PO TABS
25.0000 mg | ORAL_TABLET | Freq: Three times a day (TID) | ORAL | Status: DC | PRN
  Filled 2022-08-17: qty 1

## 2022-08-17 MED ORDER — INSULIN ASPART 100 UNIT/ML IJ SOLN
0.0000 [IU] | Freq: Three times a day (TID) | INTRAMUSCULAR | Status: DC
  Administered 2022-08-18: 1 [IU] via SUBCUTANEOUS
  Filled 2022-08-17: qty 1

## 2022-08-17 MED ORDER — LORAZEPAM 1 MG PO TABS: 1.0000 mg | ORAL_TABLET | Freq: Once | ORAL | Status: DC | PRN

## 2022-08-17 MED ORDER — ACETAMINOPHEN 325 MG PO TABS: 650.0000 mg | ORAL_TABLET | Freq: Four times a day (QID) | ORAL | Status: DC | PRN

## 2022-08-17 MED ORDER — ALUM & MAG HYDROXIDE-SIMETH 200-200-20 MG/5ML PO SUSP: 30.0000 mL | ORAL | Status: DC | PRN

## 2022-08-17 MED ORDER — LORATADINE 10 MG PO TABS
10.0000 mg | ORAL_TABLET | Freq: Every day | ORAL | Status: DC
  Administered 2022-08-18: 10 mg via ORAL
  Filled 2022-08-17: qty 1

## 2022-08-17 MED ORDER — HYDROXYZINE HCL 25 MG PO TABS
25.0000 mg | ORAL_TABLET | Freq: Three times a day (TID) | ORAL | Status: DC | PRN
  Administered 2022-08-17: 25 mg via ORAL
  Filled 2022-08-17: qty 1

## 2022-08-17 MED ORDER — INSULIN ASPART PROT & ASPART (70-30 MIX) 100 UNIT/ML ~~LOC~~ SUSP
16.0000 [IU] | Freq: Two times a day (BID) | SUBCUTANEOUS | Status: DC
  Administered 2022-08-18: 16 [IU] via SUBCUTANEOUS
  Filled 2022-08-17: qty 10

## 2022-08-17 MED ORDER — MAGNESIUM HYDROXIDE 400 MG/5ML PO SUSP: 30.0000 mL | Freq: Every day | ORAL | Status: DC | PRN

## 2022-08-17 NOTE — ED Notes (Signed)
Ivc / psych consult complete / recommend psych inpatient admission when medically cleared 

## 2022-08-17 NOTE — ED Notes (Signed)
Report given to [facility] in BMU. Patient to be transferred to [facility] room 305.

## 2022-08-17 NOTE — ED Notes (Signed)
IVC/Pending Placement 

## 2022-08-17 NOTE — ED Notes (Signed)
BS 223

## 2022-08-17 NOTE — ED Notes (Deleted)
IVC Rescinded Per NP Waldon Merl

## 2022-08-17 NOTE — BH Assessment (Signed)
Patient is to be admitted to [facility] by Dr. Weber Cooks tonight 08/17/22 after 8:00pm.  Attending Physician will be Dr.  Weber Cooks .   Patient has been assigned to room 305, by [place], Junita Push.     ER staff is aware of the admission: Armani, ER Secretary   Dr. Cinda Quest, ER MD  Verline Lema, Patient's Nurse  Seth Bake, Patient Access.

## 2022-08-17 NOTE — BH Assessment (Signed)
Per Kennyth Lose, NP, patient is recommended for inpatient psychiatric admission. Patient referral will be sent out.

## 2022-08-17 NOTE — BH Assessment (Signed)
Patient is under review at [facility]. Awaiting accepting details for night shift admission.

## 2022-08-17 NOTE — ED Notes (Signed)
Breakfast tray given to pt 

## 2022-08-17 NOTE — ED Notes (Signed)
IVC/recommend psych inpatient admission when medically clear

## 2022-08-17 NOTE — ED Notes (Signed)
Breakfast meal tray delivered by [place], NT

## 2022-08-17 NOTE — Plan of Care (Signed)
Pt new to the unit tonight, hasn't had time to progress  Problem: Education: Goal: Knowledge of General Education information will improve Description: Including pain rating scale, medication(s)/side effects and non-pharmacologic comfort measures Outcome: Not Progressing   Problem: Health Behavior/Discharge Planning: Goal: Ability to manage health-related needs will improve Outcome: Not Progressing   Problem: Clinical Measurements: Goal: Ability to maintain clinical measurements within normal limits will improve Outcome: Not Progressing Goal: Will remain free from infection Outcome: Not Progressing Goal: Diagnostic test results will improve Outcome: Not Progressing Goal: Respiratory complications will improve Outcome: Not Progressing Goal: Cardiovascular complication will be avoided Outcome: Not Progressing   Problem: Activity: Goal: Risk for activity intolerance will decrease Outcome: Not Progressing   Problem: Nutrition: Goal: Adequate nutrition will be maintained Outcome: Not Progressing   Problem: Coping: Goal: Level of anxiety will decrease Outcome: Not Progressing   Problem: Elimination: Goal: Will not experience complications related to bowel motility Outcome: Not Progressing Goal: Will not experience complications related to urinary retention Outcome: Not Progressing   Problem: Pain Managment: Goal: General experience of comfort will improve Outcome: Not Progressing   Problem: Safety: Goal: Ability to remain free from injury will improve Outcome: Not Progressing   Problem: Skin Integrity: Goal: Risk for impaired skin integrity will decrease Outcome: Not Progressing

## 2022-08-17 NOTE — ED Provider Notes (Signed)
Received this patient in signout tonight psychiatric evaluation; involuntary commitment. Safety check performed by security, see previous provider note  At this time assessed the patient and spoke with both the parent and the patient long with our charge nurse and decided to allow the mother to stay in the room with the patient overnight to facilitate ongoing monitoring and psychiatric evaluation in the morning.    Lucillie Garfinkel, MD 08/17/22 [phone]

## 2022-08-17 NOTE — Tx Team (Signed)
Initial Treatment Plan 08/17/2022 9:14 PM Kristina Sparks HHI:[id number]    PATIENT STRESSORS: Other: Problems with school     PATIENT STRENGTHS: Ability for insight  Supportive family/friends    PATIENT IDENTIFIED PROBLEMS: Anxiety  Depression  Suicidal Ideation                 DISCHARGE CRITERIA:  Improved stabilization in mood, thinking, and/or behavior Verbal commitment to aftercare and medication compliance  PRELIMINARY DISCHARGE PLAN: Outpatient therapy Return to previous living arrangement  PATIENT/FAMILY INVOLVEMENT: This treatment plan has been presented to and reviewed with the patient, Kristina Sparks. The patient has been given the opportunity to ask questions and make suggestions.  Mallie Darting, RN 08/17/2022, 9:14 PM

## 2022-08-17 NOTE — ED Notes (Signed)
CBG check 221

## 2022-08-17 NOTE — ED Notes (Signed)
Report received from [facility]. Patient care assumed. Patient/RN introduction complete. Will continue to monitor. Pt pending revaluation in am, pending admission. Mother continues to be at bedside.

## 2022-08-17 NOTE — ED Notes (Addendum)
Kristina Sparks, NT at bedside to check pt's CBG level, pt refusing stating she would like it to check her Dexacom, explained that we would allow mother to check her phone for the CBG level and she would not be allowed. Pt agreed.   CBG/Dexacom level reads 170

## 2022-08-17 NOTE — Inpatient Diabetes Management (Signed)
Inpatient Diabetes Program Recommendations  AACE/ADA: New Consensus Statement on Inpatient Glycemic Control (2015)  Target Ranges:  Prepandial:   less than 140 mg/dL      Peak postprandial:   less than 180 mg/dL (1-2 hours)      Critically ill patients:  140 - 180 mg/dL   Lab Results  Component Value Date   GLUCAP 261 (H) 09/01/2021   HGBA1C 13.3 (H) 08/30/2021    Review of Glycemic Control  Diabetes history: DM1 (requires basal, meal coverage, and correction) Outpatient Diabetes medications: 70/30 16 units bid ac meals Current orders for Inpatient glycemic control: 70/30 16 units bid, Novolog 0-9 units tid meal correction  Inpatient Diabetes Program Recommendations:   Spoke with [facility] [employer] RN caring for patient regarding insulin and CBGs. RN has not received 70/30 insulin to be given to patient -note sent to pharmacy by DM coordinator.  -Add 0-5 units hs   Thank you, Nani Gasser. Deseray Daponte, RN, MSN, CDE  Diabetes Coordinator Inpatient Glycemic Control Team Team Pager [phone] (8am-5pm) 08/17/2022 10:37 AM

## 2022-08-17 NOTE — ED Notes (Signed)
Pt pending reeval this am

## 2022-08-17 NOTE — ED Notes (Signed)
Report off to [employer].  Pt moved to room 25.

## 2022-08-17 NOTE — Progress Notes (Signed)
Patient calm and pleasant during assessment denying SI/HI/AVH. Pt stated she made a comment about taking to much insulin after she failed an exam in school. Pt stated to staff that she doesn't want to die, she was just overwhelmed with school. Pt given education and support. Pt oriented to her room and the unit, given snack. Pt compliant with medication administration per MD orders. Pt observed interacting appropriately with staff and peers on the unit. Pt being monitored Q 15 minutes for safety per unit protocol, remains safe on the unit.

## 2022-08-17 NOTE — ED Notes (Signed)
Mother still in room with pt and her belongings.  Francoise Schaumann charge nurse aware.  EDP spoke with both pt and mother at length about IVC.  Pt calm at this time.

## 2022-08-18 DIAGNOSIS — F4323 Adjustment disorder with mixed anxiety and depressed mood: Secondary | ICD-10-CM | POA: Diagnosis not present

## 2022-08-18 LAB — GLUCOSE, CAPILLARY
Glucose-Capillary: 136 mg/dL — ABNORMAL HIGH (ref 70–99)
Glucose-Capillary: 112 mg/dL — ABNORMAL HIGH (ref 70–99)

## 2022-08-18 MED ORDER — HYDROXYZINE HCL 25 MG PO TABS: 25.0000 mg | ORAL_TABLET | Freq: Three times a day (TID) | ORAL | 0 refills | Status: DC | PRN

## 2022-08-18 MED ORDER — ESCITALOPRAM OXALATE 10 MG PO TABS
10.0000 mg | ORAL_TABLET | Freq: Every day | ORAL | Status: DC
  Administered 2022-08-18: 10 mg via ORAL
  Filled 2022-08-18: qty 1

## 2022-08-18 MED ORDER — ESCITALOPRAM OXALATE 10 MG PO TABS: 10.0000 mg | ORAL_TABLET | Freq: Every day | ORAL | 1 refills | Status: DC

## 2022-08-18 NOTE — Discharge Summary (Signed)
Physician Discharge Summary Note  Patient:  Kristina Sparks is an 21 y.o., female MRN:  [id number] DOB:  04/15/2001 Patient phone:  [phone] (home)  Patient address:   [address] [place] [address],  Total Time spent with patient: 1 hour  Date of Admission:  08/17/2022 Date of Discharge: 08/18/2022  Reason for Admission: Patient was admitted after presenting to the emergency room with complaints of anxiety accompanied by transient suicidal ideation  Principal Problem: Adjustment disorder with mixed anxiety and depressed mood Discharge Diagnoses: Principal Problem:   Adjustment disorder with mixed anxiety and depressed mood Active Problems:   Type 1 diabetes mellitus with hyperglycemia (HCC)   Generalized anxiety disorder   Past Psychiatric History: Past history of anxiety and therapy but no history of suicide attempts no history of psychosis no history of medication management and no previous hospitalization  Past Medical History:  Past Medical History:  Diagnosis Date   Diabetes mellitus type 1, uncomplicated ([place])    History reviewed. No pertinent surgical history. Family History:  Family History  Problem Relation Age of Onset   Cancer Paternal Grandmother    Breast cancer Maternal Grandmother    Throat cancer Maternal Grandmother    Crohn's disease Maternal Uncle    Family Psychiatric  History: None known Social History:  Social History   Substance and Sexual Activity  Alcohol Use Yes     Social History   Substance and Sexual Activity  Drug Use Yes   Types: Marijuana    Social History   Socioeconomic History   Marital status: Single    Spouse name: Not on file   Number of children: Not on file   Years of education: Not on file   Highest education level: Not on file  Occupational History   Not on file  Tobacco Use   Smoking status: Never   Smokeless tobacco: Never  Vaping Use   Vaping Use: Never used  Substance and Sexual Activity    Alcohol use: Yes   Drug use: Yes    Types: Marijuana   Sexual activity: Never  Other Topics Concern   Not on file  Social History Narrative   ** Merged History Encounter **       LIves at home with mother.    Social Determinants of Health   Financial Resource Strain: Not on file  Food Insecurity: No Food Insecurity (08/17/2022)   Hunger Vital Sign    Worried About Running Out of Food in the Last Year: Never true    Ran Out of Food in the Last Year: Never true  Transportation Needs: No Transportation Needs (08/17/2022)   PRAPARE - Hydrologist (Medical): No    Lack of Transportation (Non-Medical): No  Physical Activity: Not on file  Stress: Not on file  Social Connections: Not on file    Hospital Course: Patient has been kept on 15-minute checks.  She has not shown any dangerous aggressive violent or suicidal behavior.  She has denied suicidal ideation.  Patient has been cooperative and forthcoming with information and treatment.  After talking with me today she again reiterated that she has no suicidal intent or plan and was able to articulate multiple positive strategies for dealing with her life stressors.  Patient has been cooperative with treatment.  We discussed the option of starting SSRI for chronic anxiety and she is agreeable to that.  Side effects reviewed and we will start 10 mg a day Lexapro prescription  provided.  Also provide hydroxyzine as needed for sleep.  Strongly recommend follow-up with outpatient therapy and medication management  Physical Findings: AIMS:  , ,  ,  ,    CIWA:    COWS:     Musculoskeletal: Strength & Muscle Tone: within normal limits Gait & Station: normal Patient leans: N/A   Psychiatric Specialty Exam:  Presentation  General Appearance: Appropriate for Environment  Eye Contact:Good  Speech:Clear and Coherent  Speech Volume:Decreased  Handedness:Right   Mood and Affect  Mood:Irritable; Anxious;  Angry  Affect:Depressed; Constricted; Tearful   Thought Process  Thought Processes:Coherent  Descriptions of Associations:Intact  Orientation:Full (Time, Place and Person)  Thought Content:Logical; Tangential  History of Schizophrenia/Schizoaffective disorder:No  Duration of Psychotic Symptoms:No data recorded Hallucinations:No data recorded Ideas of Reference:None  Suicidal Thoughts:No data recorded Homicidal Thoughts:No data recorded  Sensorium  Memory:Immediate Good; Recent Good; Remote Good  Judgment:Poor  Insight:Poor   Executive Functions  Concentration:Fair  Attention Span:Fair  [place]   Psychomotor Activity  Psychomotor Activity:No data recorded  Assets  Assets:Communication Skills; Desire for Improvement; Physical Health; Resilience; Social Support   Sleep  Sleep:No data recorded   Physical Exam: Physical Exam Vitals reviewed.  Constitutional:      Appearance: Normal appearance.  HENT:     Head: Normocephalic and atraumatic.     Mouth/Throat:     Pharynx: Oropharynx is clear.  Eyes:     Pupils: Pupils are equal, round, and reactive to light.  Cardiovascular:     Rate and Rhythm: Normal rate and regular rhythm.  Pulmonary:     Effort: Pulmonary effort is normal.     Breath sounds: Normal breath sounds.  Abdominal:     General: Abdomen is flat.     Palpations: Abdomen is soft.  Musculoskeletal:        General: Normal range of motion.  Skin:    General: Skin is warm and dry.  Neurological:     General: No focal deficit present.     Mental Status: She is alert. Mental status is at baseline.  Psychiatric:        Attention and Perception: Attention normal.        Mood and Affect: Mood is anxious.        Speech: Speech normal.        Behavior: Behavior normal.        Thought Content: Thought content normal. Thought content does not include suicidal ideation.        Cognition and Memory:  Cognition normal.    Review of Systems  Constitutional: Negative.   HENT: Negative.    Eyes: Negative.   Respiratory: Negative.    Cardiovascular: Negative.   Gastrointestinal: Negative.   Musculoskeletal: Negative.   Skin: Negative.   Neurological: Negative.   Psychiatric/Behavioral:  Negative for depression, hallucinations, memory loss, substance abuse and suicidal ideas. The patient is nervous/anxious and has insomnia.    Blood pressure 108/77, pulse 82, temperature 98.5 F (36.9 C), temperature source Oral, resp. rate 18, height 5' 7"  (1.702 m), weight 73.9 kg, last menstrual period 07/25/2022, SpO2 100 %. Body mass index is 25.52 kg/m.   Social History   Tobacco Use  Smoking Status Never  Smokeless Tobacco Never   Tobacco Cessation:  N/A, patient does not currently use tobacco products   Blood Alcohol level:  Lab Results  Component Value Date   ETH <10 84/16/6063    Metabolic Disorder Labs:  Lab Results  Component Value Date   HGBA1C 13.3 (H) 08/30/2021   MPG 335.01 08/30/2021   No results found for: "PROLACTIN" No results found for: "CHOL", "TRIG", "HDL", "CHOLHDL", "VLDL", "[place]"  See Psychiatric Specialty Exam and Suicide Risk Assessment completed by Attending Physician prior to discharge.  Discharge destination:  Home  Is patient on multiple antipsychotic therapies at discharge:  No   Has Patient had three or more failed trials of antipsychotic monotherapy by history:  No  Recommended Plan for Multiple Antipsychotic Therapies: NA  Discharge Instructions     Diet - low sodium heart healthy   Complete by: As directed    Increase activity slowly   Complete by: As directed       Allergies as of 08/18/2022   No Known Allergies      Medication List     TAKE these medications      Indication  [place] FE 1/20 1-20 MG-MCG tablet Generic drug: norethindrone-ethinyl estradiol-FE Take 1 tablet by mouth daily.  Indication: Birth Control  Treatment   blood glucose meter kit and supplies Kit Dispense based on patient and insurance preference. Use up to four times daily as directed.  Indication: Diabetes   cetirizine 10 MG tablet Commonly known as: ZyrTEC Allergy Take 1 tablet (10 mg total) by mouth daily. What changed:  when to take this reasons to take this  Indication: Perennial Allergic Rhinitis   Dexcom G7 Sensor Misc 1 Device by Does not apply route as directed.  Indication: Insulin-Dependent Diabetes   escitalopram 10 MG tablet Commonly known as: LEXAPRO Take 1 tablet (10 mg total) by mouth daily.  Indication: Generalized Anxiety Disorder   hydrOXYzine 25 MG tablet Commonly known as: ATARAX Take 1 tablet (25 mg total) by mouth 3 (three) times daily as needed (Sleep).  Indication: Insomnia   insulin aspart protamine - aspart (70-30) 100 UNIT/ML FlexPen Commonly known as: NOVOLOG 70/30 MIX Inject 16 Units into the skin 2 (two) times daily.  Indication: Insulin-Dependent Diabetes   Insulin Pen Needle 32G X 4 MM Misc 1 Device by Does not apply route in the morning and at bedtime.  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  Patient is able to articulate multiple strategies for dealing with her school problems which she is already doing.  Continue to get help from her mother.  Prescriptions provided for current medicine  Comments: See above  Signed: Alethia Berthold, MD 08/18/2022, 11:10 AM

## 2022-08-18 NOTE — BHH Suicide Risk Assessment (Signed)
[facility] Discharge Suicide Risk Assessment   Principal Problem: Adjustment disorder with mixed anxiety and depressed mood Discharge Diagnoses: Principal Problem:   Adjustment disorder with mixed anxiety and depressed mood Active Problems:   Type 1 diabetes mellitus with hyperglycemia (HCC)   Generalized anxiety disorder   Total Time spent with patient: 45 minutes  Musculoskeletal: Strength & Muscle Tone: within normal limits Gait & Station: normal Patient leans: N/A  Psychiatric Specialty Exam  Presentation  General Appearance: Appropriate for Environment  Eye Contact:Good  Speech:Clear and Coherent  Speech Volume:Decreased  Handedness:Right   Mood and Affect  Mood:Irritable; Anxious; Angry  Duration of Depression Symptoms: No data recorded Affect:Depressed; Constricted; Tearful   Thought Process  Thought Processes:Coherent  Descriptions of Associations:Intact  Orientation:Full (Time, Place and Person)  Thought Content:Logical; Tangential  History of Schizophrenia/Schizoaffective disorder:No  Duration of Psychotic Symptoms:No data recorded Hallucinations:No data recorded Ideas of Reference:None  Suicidal Thoughts:No data recorded Homicidal Thoughts:No data recorded  Sensorium  Memory:Immediate Good; Recent Good; Remote Good  Judgment:Poor  Insight:Poor   Executive Functions  Concentration:Fair  Attention Span:Fair  Recall:Good  Fund of Knowledge:Fair  Language:Fair   Psychomotor Activity  Psychomotor Activity:No data recorded  Assets  Assets:Communication Skills; Desire for Improvement; Physical Health; Resilience; Social Support   Sleep  Sleep:No data recorded  Physical Exam: Physical Exam Vitals and nursing note reviewed.  Constitutional:      Appearance: Normal appearance.  HENT:     Head: Normocephalic and atraumatic.     Mouth/Throat:     Pharynx: Oropharynx is clear.  Eyes:     Pupils: Pupils are equal, round, and  reactive to light.  Cardiovascular:     Rate and Rhythm: Normal rate and regular rhythm.  Pulmonary:     Effort: Pulmonary effort is normal.     Breath sounds: Normal breath sounds.  Abdominal:     General: Abdomen is flat.     Palpations: Abdomen is soft.  Musculoskeletal:        General: Normal range of motion.  Skin:    General: Skin is warm and dry.  Neurological:     General: No focal deficit present.     Mental Status: She is alert. Mental status is at baseline.  Psychiatric:        Attention and Perception: Attention normal.        Mood and Affect: Mood is anxious.        Speech: Speech normal.        Behavior: Behavior normal.        Thought Content: Thought content normal. Thought content does not include suicidal ideation.        Cognition and Memory: Cognition normal.    Review of Systems  Constitutional: Negative.   HENT: Negative.    Eyes: Negative.   Respiratory: Negative.    Cardiovascular: Negative.   Gastrointestinal: Negative.   Musculoskeletal: Negative.   Skin: Negative.   Neurological: Negative.   Psychiatric/Behavioral:  Negative for depression, hallucinations, memory loss, substance abuse and suicidal ideas. The patient is nervous/anxious and has insomnia.    Blood pressure 108/77, pulse 82, temperature 98.5 F (36.9 C), temperature source Oral, resp. rate 18, height 5\' 7"  (1.702 m), weight 73.9 kg, last menstrual period 07/25/2022, SpO2 100 %. Body mass index is 25.52 kg/m.  Mental Status Per Nursing Assessment::   On Admission:  Self-harm thoughts (Patient stated she would OD on her insulin, but told [phone] she didn't want to die)  Demographic Factors:  Adolescent or young adult  Loss Factors: School stresses  Historical Factors: NA  Risk Reduction Factors:   Sense of responsibility to family, Religious beliefs about death, Living with another person, especially a relative, Positive social support, Positive therapeutic relationship, and  Positive coping skills or problem solving skills  Continued Clinical Symptoms:  Severe Anxiety and/or Agitation  Cognitive Features That Contribute To Risk:  None    Suicide Risk:  Minimal: No identifiable suicidal ideation.  Patients presenting with no risk factors but with morbid ruminations; may be classified as minimal risk based on the severity of the depressive symptoms    Plan Of Care/Follow-up recommendations:  Other:  Reviewed treatment options.  Patient is very agreeable and motivated to seek therapy.  We agreed to start low-dose Lexapro for initial treatment of chronic anxiety.  Patient at this point denies all suicidal ideation and appears to be calm with good insight and positive outlook.  Does not appear to meet commitment criteria.  Plan is for discharge with recommended outpatient mental health treatment  Alethia Berthold, MD 08/18/2022, 11:00 AM

## 2022-08-18 NOTE — Progress Notes (Signed)
Patient pleasant and cooperative on approach. Denies SI,HI and AVH. Verbalized understanding discharge instructions,follow up care and prescription. No personal belongings in the locker to return. Patient escorted out by staff and transported by family.

## 2022-08-18 NOTE — Progress Notes (Signed)
Recreation Therapy Notes  Date: 08/18/2022   Time: 10:50 am   Location: Craft room    Behavioral response: N/A   Intervention Topic: Decision Making    Discussion/Intervention: Patient refused to attend group.   Clinical Observations/Feedback:  Patient refused to attend group.   Derin Matthes LRT/CTRS          Syrina Wake 08/18/2022 11:00 AM

## 2022-08-18 NOTE — H&P (Signed)
Psychiatric Admission Assessment Adult  Patient Identification: Kristina Sparks MRN:  [id number] Date of Evaluation:  08/18/2022 Chief Complaint:  MDD (major depressive disorder) [F32.9] Principal Diagnosis: Adjustment disorder with mixed anxiety and depressed mood Diagnosis:  Principal Problem:   Adjustment disorder with mixed anxiety and depressed mood Active Problems:   Type 1 diabetes mellitus with hyperglycemia (HCC)   Generalized anxiety disorder  History of Present Illness: Patient seen and chart reviewed.  21 year old woman brought into the emergency room after an officer was asked to check on her on Tuesday.  Patient had found out that she failed a test at college.  This is the most recent of several tests that she has failed and she began to panic feeling that her career was over and that there was no way she would pass this semester.  Patient had spoken with her mother but then went off to be by herself causing family to become concerned.  Patient made statements about overdosing on insulin and having suicidal thoughts and so was brought to the emergency room.  Patient has denied since then having any actual intent or plan to harm herself and there is no evidence that she had been trying to harm her self.  Patient was forthcoming with me discussing anxiety about not doing as well as she normally does in school this semester.  Feeling run down much of the time possibly from her diabetes.  Having a little bit of trouble sleeping at night from chronic anxiety.  Denies alcohol or drug abuse.  Not currently seeing anyone for outpatient mental health treatment.  No evidence of psychosis.  Has good support from family who live in [place]. Associated Signs/Symptoms: Depression Symptoms:  insomnia, difficulty concentrating, Duration of Depression Symptoms: No data recorded (Hypo) Manic Symptoms:   None reported Anxiety Symptoms:  Excessive Worry, Psychotic Symptoms:   None PTSD  Symptoms: Negative Total Time spent with patient: 1 hour  Past Psychiatric History: Patient has a past history of seeing a therapist when some relatives died a few years ago.  No prior hospitalizations.  No history of suicide attempts or self-injury.  Has not been prescribed any psychiatric medicine.  Denies alcohol or drug abuse.  No history of violence.  Is the patient at risk to self? No.  Has the patient been a risk to self in the past 6 months? No.  Has the patient been a risk to self within the distant past? No.  Is the patient a risk to others? No.  Has the patient been a risk to others in the past 6 months? No.  Has the patient been a risk to others within the distant past? No.   [place] Scale:  [place] Admission (Current) from 08/17/2022 in [facility] ED from 08/16/2022 in [facility] to Hosp-Admission (Discharged) from 08/30/2021 in [facility]Y  C-SSRS RISK CATEGORY Low Risk High Risk No Risk        Prior Inpatient Therapy:   Prior Outpatient Therapy:    Alcohol Screening: 1. How often do you have a drink containing alcohol?: Never 2. How many drinks containing alcohol do you have on a typical day when you are drinking?: 1 or 2 3. How often do you have six or more drinks on one occasion?: Never AUDIT-C Score: 0 4. How often during the last year have you found that you were not able to stop drinking once you had started?: Never 5. How  often during the last year have you failed to do what was normally expected from you because of drinking?: Never 6. How often during the last year have you needed a first drink in the morning to get yourself going after a heavy drinking session?: Never 7. How often during the last year have you had a feeling of guilt of remorse after drinking?: Never 8. How often during the last year have you been unable to remember what happened the night before because you  had been drinking?: Never 9. Have you or someone else been injured as a result of your drinking?: No 10. Has a relative or friend or a doctor or another health worker been concerned about your drinking or suggested you cut down?: No Alcohol Use Disorder Identification Test Final Score (AUDIT): 0 Substance Abuse History in the last 12 months:  No. Consequences of Substance Abuse: Negative Previous Psychotropic Medications: No  Psychological Evaluations: No  Past Medical History:  Past Medical History:  Diagnosis Date   Diabetes mellitus type 1, uncomplicated ([place])    History reviewed. No pertinent surgical history. Family History:  Family History  Problem Relation Age of Onset   Cancer Paternal Grandmother    Breast cancer Maternal Grandmother    Throat cancer Maternal Grandmother    Crohn's disease Maternal Uncle    Family Psychiatric  History: Does not know of any family history of mental health problems Tobacco Screening:   Social History:  Social History   Substance and Sexual Activity  Alcohol Use Yes     Social History   Substance and Sexual Activity  Drug Use Yes   Types: Marijuana    Additional Social History:                           Allergies:  No Known Allergies Lab Results:  Results for orders placed or performed during the hospital encounter of 08/17/22 (from the past 48 hour(s))  Glucose, capillary     Status: Abnormal   Collection Time: 08/18/22  6:48 AM  Result Value Ref Range   Glucose-Capillary 136 (H) 70 - 99 mg/dL    Comment: Glucose reference range applies only to samples taken after fasting for at least 8 hours.   Comment 1 Notify RN     Blood Alcohol level:  Lab Results  Component Value Date   ETH <10 21/30/8657    Metabolic Disorder Labs:  Lab Results  Component Value Date   HGBA1C 13.3 (H) 08/30/2021   MPG 335.01 08/30/2021   No results found for: "PROLACTIN" No results found for: "CHOL", "TRIG", "HDL", "CHOLHDL",  "VLDL", "LDLCALC"  Current Medications: Current Facility-Administered Medications  Medication Dose Route Frequency Provider Last Rate Last Admin   acetaminophen (TYLENOL) tablet 650 mg  650 mg Oral Q6H PRN Sherlon Handing, NP       alum & mag hydroxide-simeth (MAALOX/MYLANTA) 200-200-20 MG/5ML suspension 30 mL  30 mL Oral Q4H PRN Waldon Merl F, NP       escitalopram (LEXAPRO) tablet 10 mg  10 mg Oral Daily Lennart Gladish T, MD       hydrOXYzine (ATARAX) tablet 25 mg  25 mg Oral TID PRN Sherlon Handing, NP   25 mg at 08/17/22 2129   insulin aspart (novoLOG) injection 0-9 Units  0-9 Units Subcutaneous TID WC Waldon Merl F, NP   1 Units at 08/18/22 0753   insulin aspart protamine- aspart (NOVOLOG MIX 70/30) injection 16  Units  16 Units Subcutaneous BID WC Waldon Merl F, NP   16 Units at 08/18/22 0750   loratadine (CLARITIN) tablet 10 mg  10 mg Oral Daily Waldon Merl F, NP   10 mg at 08/18/22 0753   LORazepam (ATIVAN) tablet 1 mg  1 mg Oral Once PRN Sherlon Handing, NP       magnesium hydroxide (MILK OF MAGNESIA) suspension 30 mL  30 mL Oral Daily PRN Sherlon Handing, NP       PTA Medications: Medications Prior to Admission  Medication Sig Dispense Refill Last Dose   BLISOVI FE 1/20 1-20 MG-MCG tablet Take 1 tablet by mouth daily.      blood glucose meter kit and supplies KIT Dispense based on patient and insurance preference. Use up to four times daily as directed. 1 each 0    cetirizine (ZYRTEC ALLERGY) 10 MG tablet Take 1 tablet (10 mg total) by mouth daily. (Patient taking differently: Take 10 mg by mouth daily as needed for allergies.) 14 tablet 0    Continuous Blood Gluc Sensor (DEXCOM G7 SENSOR) MISC 1 Device by Does not apply route as directed. 9 each 3    insulin aspart protamine - aspart (NOVOLOG 70/30 MIX) (70-30) 100 UNIT/ML FlexPen Inject 16 Units into the skin 2 (two) times daily. 30 mL 3    Insulin Pen Needle 32G X 4 MM MISC 1 Device by Does not apply  route in the morning and at bedtime. 200 each 3     Musculoskeletal: Strength & Muscle Tone: within normal limits Gait & Station: normal Patient leans: N/A            Psychiatric Specialty Exam:  Presentation  General Appearance: Appropriate for Environment  Eye Contact:Good  Speech:Clear and Coherent  Speech Volume:Decreased  Handedness:Right   Mood and Affect  Mood:Irritable; Anxious; Angry  Affect:Depressed; Constricted; Tearful   Thought Process  Thought Processes:Coherent  Duration of Psychotic Symptoms: No data recorded Past Diagnosis of Schizophrenia or Psychoactive disorder: No  Descriptions of Associations:Intact  Orientation:Full (Time, Place and Person)  Thought Content:Logical; Tangential  Hallucinations:No data recorded Ideas of Reference:None  Suicidal Thoughts:No data recorded Homicidal Thoughts:No data recorded  Sensorium  Memory:Immediate Good; Recent Good; Remote Good  Judgment:Poor  Insight:Poor   Executive Functions  Concentration:Fair  Attention Span:Fair  Recall:Good  Fund of Knowledge:Fair  Language:Fair   Psychomotor Activity  Psychomotor Activity:No data recorded  Assets  Assets:Communication Skills; Desire for Improvement; Physical Health; Resilience; Social Support   Sleep  Sleep:No data recorded   Physical Exam: Physical Exam Vitals and nursing note reviewed.  Constitutional:      Appearance: Normal appearance.  HENT:     Head: Normocephalic and atraumatic.     Mouth/Throat:     Pharynx: Oropharynx is clear.  Eyes:     Pupils: Pupils are equal, round, and reactive to light.  Cardiovascular:     Rate and Rhythm: Normal rate and regular rhythm.  Pulmonary:     Effort: Pulmonary effort is normal.     Breath sounds: Normal breath sounds.  Abdominal:     General: Abdomen is flat.     Palpations: Abdomen is soft.  Musculoskeletal:        General: Normal range of motion.  Skin:     General: Skin is warm and dry.  Neurological:     General: No focal deficit present.     Mental Status: She is alert. Mental status is at baseline.  Psychiatric:  Attention and Perception: Attention normal.        Mood and Affect: Mood is anxious.        Speech: Speech normal.        Behavior: Behavior normal.        Thought Content: Thought content normal. Thought content does not include suicidal ideation.        Cognition and Memory: Cognition normal.        Judgment: Judgment normal.    Review of Systems  Constitutional: Negative.   HENT: Negative.    Eyes: Negative.   Respiratory: Negative.    Cardiovascular: Negative.   Gastrointestinal: Negative.   Musculoskeletal: Negative.   Skin: Negative.   Neurological: Negative.   Psychiatric/Behavioral:  Negative for depression, hallucinations, memory loss, substance abuse and suicidal ideas. The patient is nervous/anxious and has insomnia.    Blood pressure 108/77, pulse 82, temperature 98.5 F (36.9 C), temperature source Oral, resp. rate 18, height _0  (1.702 m), weight 73.9 kg, last menstrual period 07/25/2022, SpO2 100 %. Body mass index is 25.52 kg/m.  Treatment Plan Summary: Medication management and Plan 21 year old woman who is having academic problems in school this year.  Also was diagnosed with diabetes a year ago and has struggled with getting her sugars stable.  Had moment of feeling hopeless after failing a test.  Did not actually try to harm herself.  Patient now denies any suicidal ideation and is able to articulate multiple possible options for dealing with her problems.  We discussed medication management and she focused on chronic anxiety as an issue so we will recommend a trial of starting Lexapro 10 mg/day.  Side effects reviewed patient agrees.  She can also have a prescription for some as needed hydroxyzine especially for sleep.  At this point no longer meets any commitment criteria nor does she require  inpatient hospitalization.  Agree with the request that she has for discharge from the hospital with recommended follow-up with therapist and mental health providers as soon as possible.  Patient educated about the importance of letting people know if she has any actual thoughts about self-harm.  Supportive counseling completed.  Observation Level/Precautions:  15 minute checks  Laboratory:  UDS  Psychotherapy:    Medications:    Consultations:    Discharge Concerns:    Estimated LOS:  Other:     Physician Treatment Plan for Primary Diagnosis: Adjustment disorder with mixed anxiety and depressed mood Long Term Goal(s): Improvement in symptoms so as ready for discharge  Short Term Goals: Ability to verbalize feelings will improve, Ability to demonstrate self-control will improve, Ability to identify and develop effective coping behaviors will improve, and Ability to maintain clinical measurements within normal limits will improve  Physician Treatment Plan for Secondary Diagnosis: Principal Problem:   Adjustment disorder with mixed anxiety and depressed mood Active Problems:   Type 1 diabetes mellitus with hyperglycemia (HCC)   Generalized anxiety disorder  Long Term Goal(s): Improvement in symptoms so as ready for discharge  Short Term Goals: Compliance with prescribed medications will improve  I certify that inpatient services furnished can reasonably be expected to improve the patient's condition.    Alethia Berthold, MD 9/28/202311:02 AM

## 2022-08-18 NOTE — BHH Suicide Risk Assessment (Signed)
[facility] Admission Suicide Risk Assessment   Nursing information obtained from:  Patient Demographic factors:  Adolescent or young adult Current Mental Status:  Self-harm thoughts (Patient stated she would OD on her insulin, but told us she didn't want to die) Loss Factors:  Decline in physical health Historical Factors:  NA Risk Reduction Factors:  Positive social support, Positive therapeutic relationship  Total Time spent with patient: 45 minutes Principal Problem: Adjustment disorder with mixed anxiety and depressed mood Diagnosis:  Principal Problem:   Adjustment disorder with mixed anxiety and depressed mood Active Problems:   Type 1 diabetes mellitus with hyperglycemia (HCC)   Generalized anxiety disorder  Subjective Data: Patient seen and chart reviewed.  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  Patient has denied any suicidal thought intent or plan since being in the ER and certainly since being on the unit.  In interview today patient is forthcoming about her major life stresses recently and admits to having made comments about overdosing on insulin but denies that she ever had any intention or plan to harm her self.  She is able to articulate positive plans about her future at this point.  Denies suicidal ideation  Continued Clinical Symptoms:  Alcohol Use Disorder Identification Test Final Score (AUDIT): 0 The "Alcohol Use Disorders Identification Test", Guidelines for Use in Primary Care, Second Edition.  World Science writer [facility]). Score between 0-7:  no or low risk or alcohol related problems. Score between 8-15:  moderate risk of alcohol related problems. Score between 16-19:  high risk of alcohol related problems. Score 20 or above:  warrants further diagnostic evaluation for alcohol dependence and treatment.   CLINICAL FACTORS:   Severe Anxiety and/or Agitation   Musculoskeletal: Strength & Muscle Tone: within normal  limits Gait & Station: normal Patient leans: N/A  Psychiatric Specialty Exam:  Presentation  General Appearance: Appropriate for Environment  Eye Contact:Good  Speech:Clear and Coherent  Speech Volume:Decreased  Handedness:Right   Mood and Affect  Mood:Irritable; Anxious; Angry  Affect:Depressed; Constricted; Tearful   Thought Process  Thought Processes:Coherent  Descriptions of Associations:Intact  Orientation:Full (Time, Place and Person)  Thought Content:Logical; Tangential  History of Schizophrenia/Schizoaffective disorder:No  Duration of Psychotic Symptoms:No data recorded Hallucinations:No data recorded Ideas of Reference:None  Suicidal Thoughts:No data recorded Homicidal Thoughts:No data recorded  Sensorium  Memory:Immediate Good; Recent Good; Remote Good  Judgment:Poor  Insight:Poor   Executive Functions  Concentration:Fair  Attention Span:Fair  Recall:Good  Fund of Knowledge:Fair  Language:Fair   Psychomotor Activity  Psychomotor Activity:No data recorded  Assets  Assets:Communication Skills; Desire for Improvement; Physical Health; Resilience; Social Support   Sleep  Sleep:No data recorded   Physical Exam: Physical Exam Vitals reviewed.  Constitutional:      Appearance: Normal appearance.  HENT:     Head: Normocephalic and atraumatic.     Mouth/Throat:     Pharynx: Oropharynx is clear.  Eyes:     Pupils: Pupils are equal, round, and reactive to light.  Cardiovascular:     Rate and Rhythm: Normal rate and regular rhythm.  Pulmonary:     Effort: Pulmonary effort is normal.     Breath sounds: Normal breath sounds.  Abdominal:     General: Abdomen is flat.     Palpations: Abdomen is soft.  Musculoskeletal:        General: Normal range of motion.  Skin:    General: Skin is warm and dry.  Neurological:  General: No focal deficit present.     Mental Status: She is alert. Mental status is at baseline.   Psychiatric:        Attention and Perception: Attention normal.        Mood and Affect: Mood is anxious.        Speech: Speech normal.        Behavior: Behavior normal.        Thought Content: Thought content normal. Thought content does not include suicidal ideation.        Cognition and Memory: Cognition normal.        Judgment: Judgment normal.    Review of Systems  Constitutional: Negative.   HENT: Negative.    Eyes: Negative.   Respiratory: Negative.    Cardiovascular: Negative.   Gastrointestinal: Negative.   Musculoskeletal: Negative.   Skin: Negative.   Neurological: Negative.   Psychiatric/Behavioral:  Negative for depression, hallucinations, memory loss, substance abuse and suicidal ideas. The patient is nervous/anxious and has insomnia.    Blood pressure 108/77, pulse 82, temperature 98.5 F (36.9 C), temperature source Oral, resp. rate 18, height 5\' 7"  (1.702 m), weight 73.9 kg, last menstrual period 07/25/2022, SpO2 100 %. Body mass index is 25.52 kg/m.   COGNITIVE FEATURES THAT CONTRIBUTE TO RISK:  None    SUICIDE RISK:   Minimal: No identifiable suicidal ideation.  Patients presenting with no risk factors but with morbid ruminations; may be classified as minimal risk based on the severity of the depressive symptoms  PLAN OF CARE: Patient seen and able to discuss with good insight her current stresses.  15-minute checks have been continued and she has shown no dangerous behavior.  Case being reviewed with the treatment team I am going to recommend that she may be discharged today and follow up with outpatient treatment  I certify that inpatient services furnished can reasonably be expected to improve the patient's condition.   Alethia Berthold, MD 08/18/2022, 10:57 AM

## 2022-08-18 NOTE — BHH Group Notes (Signed)
[facility] Group Notes:  (Nursing/MHT/Case Management/Adjunct)  Date:  08/18/2022  Time:  11:12 AM  Type of Therapy:   community meeting  Participation Level:  Active  Participation Quality:  Attentive  Affect:  Appropriate  Cognitive:  Appropriate  Insight:  Appropriate  Engagement in Group:  Engaged  Modes of Intervention:  Discussion and Education  Summary of Progress/Problems:  Kristina Sparks 08/18/2022, 11:12 AM

## 2022-08-19 NOTE — Progress Notes (Signed)
  [facility] Adult Case Management Discharge Plan :  Will you be returning to the same living situation after discharge:  Yes,  pt plans to return home. At discharge, do you have transportation home?: Yes,  parent to provide transportation. Do you have the ability to pay for your medications: Yes,  [employer].  Release of information consent forms completed and in the chart;  Patient's signature needed at discharge.  Patient to Follow up at:  Follow-up Information     [facility] Follow up.   Why: Just in case you needs services prior to 08/24/22 appointment made through school, [facility] has walk-in hours, Monday through Friday, 8am-3pm. Thanks! Contact information: [address]  [place] [address] [phone]                 Next level of care provider has access to [place] and Suicide Prevention discussed: Yes,  SPE completed with pt.     Has patient been referred to the Quitline?: N/A patient is not a smoker  Patient has been referred for addiction treatment: N/A  Shirl Harris, LCSW 08/19/2022, 9:21 AM

## 2022-08-19 NOTE — BHH Suicide Risk Assessment (Signed)
[facility] INPATIENT:  Family/Significant Other Suicide Prevention Education  Suicide Prevention Education:  Patient Refusal for Family/Significant Other Suicide Prevention Education: The patient Kristina Sparks has refused to provide written consent for family/significant other to be provided Family/Significant Other Suicide Prevention Education during admission and/or prior to discharge.  Physician notified.  SPE completed with pt, as pt refused to consent to family contact. SPI pamphlet provided to pt and pt was encouraged to share information with support network, ask questions, and talk about any concerns relating to SPE. Pt denies access to guns/firearms and verbalized understanding of information provided. Mobile Crisis information also provided to pt.  Shirl Harris 08/19/2022, 9:15 AM

## 2022-08-19 NOTE — BHH Counselor (Signed)
Adult Comprehensive Assessment  Patient ID: Kristina Sparks, female   DOB: 25-Jun-2001, 21 y.o.   MRN: [id number]  Information Source: Information source: Patient  Current Stressors:  Patient states their primary concerns and needs for treatment are:: "On Tuesday I took a pharmacology exam. Did not do as well as I wanted...became really overwhelmed." Patient states their goals for this hospitilization and ongoing recovery are:: "To get the help that I need to not suffer in silence. To get something to help with my anxiety."  Living/Environment/Situation:  Living Arrangements: Parent  Family History:     Childhood History:     Education:     Employment/Work Situation:      Pensions consultant:      Alcohol/Substance Abuse:      Social Support System:      Leisure/Recreation:      Strengths/Needs:      Discharge Plan:      Summary/Recommendations:   Architectural technologist and Recommendations (to be completed by the evaluator): Patient is a 21 year old, female from [place], [place] ([place] She shared that she is here because she became overwhelmed after not doing well on her pharmacology exam. 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 She informed CSW that she has already arranged an appointment for mental health services through her school. Pt shared that her appointment is on 10/4 but was interested in seeing if CSW could find an earlier appointment. She is recommended to follow through with outpatient treatment including therapy and medication management as needed.  Shirl Harris. 08/19/2022

## 2022-08-22 NOTE — Telephone Encounter (Signed)
Error

## 2022-10-20 ENCOUNTER — Telehealth: Payer: Self-pay | Admitting: Dietician

## 2022-10-20 ENCOUNTER — Ambulatory Visit: Payer: BC Managed Care – PPO | Admitting: Dietician

## 2022-10-20 NOTE — Telephone Encounter (Signed)
Called patient due to missed appointment today.  Appointment rescheduled for 12/5 at 2:00.  Oran Rein, RD, LDN, CDCES

## 2022-10-25 ENCOUNTER — Ambulatory Visit: Payer: BC Managed Care – PPO | Admitting: Dietician

## 2022-11-03 ENCOUNTER — Ambulatory Visit (HOSPITAL_COMMUNITY): Payer: BC Managed Care – PPO

## 2022-11-16 ENCOUNTER — Ambulatory Visit: Payer: BC Managed Care – PPO | Admitting: Internal Medicine

## 2022-11-16 NOTE — Progress Notes (Deleted)
Name: Kristina Sparks  MRN/ DOB: [id number], 2001/03/19   Age/ Sex: 21 y.o., female    PCP: Pcp, No   Reason for Endocrinology Evaluation: Type 1 Diabetes Mellitus     Date of Initial Endocrinology Visit: 07/27/2022    PATIENT IDENTIFIER: Kristina Sparks is a 21 y.o. female with a past medical history of T1DM. The patient presented for initial endocrinology clinic visit on 11/16/2022 for consultative assistance with her diabetes management.    HPI: [name] is accompanied by her cousin    Diagnosed with DM in 08/2021 GAD-65 elevated at 5.8 U/Ml 10/25/2021 Hemoglobin A1c has ranged from 12.2% in 2023, peaking at 13.3% in 2022.    On her initial visit to our clinic she had an A1c of 12.2%, she was on NovoLog Mix which we adjusted the dose.  She declined pump technology on that visit but was prescribed Dexcom  SUBJECTIVE:   During the last visit (07/27/2022): A1c 12.2%  Today (11/16/22): Kristina Sparks is here for a follow up on diabetes management. She checks her blood sugars *** times daily. The patient has *** had hypoglycemic episodes since the last clinic visit, which typically occur *** x / - most often occuring ***. The patient is *** symptomatic with these episodes, with symptoms of {symptoms; hypoglycemia:9084048}.   She had an ED presentation for suicidal ideation on 08/16/2022 She met with our CDE on 07/28/2022  HOME DIABETES REGIMEN: Novolin 70/30 at 16 units BID    Statin: no ACE-I/ARB: no    METER DOWNLOAD SUMMARY: Did not bring    DIABETIC COMPLICATIONS: Microvascular complications:   Denies:  Last eye exam: Completed   Macrovascular complications:   Denies: CAD, PVD, CVA   PAST HISTORY: Past Medical History:  Past Medical History:  Diagnosis Date   Diabetes mellitus type 1, uncomplicated ([place])    Past Surgical History: No past surgical history on file.  Social History:  reports that she has never smoked. She has never used smokeless  tobacco. She reports current alcohol use. She reports current drug use. Drug: Marijuana. Family History:  Family History  Problem Relation Age of Onset   Cancer Paternal Grandmother    Breast cancer Maternal Grandmother    Throat cancer Maternal Grandmother    Crohn's disease Maternal Uncle      HOME MEDICATIONS: Allergies as of 11/16/2022   No Known Allergies      Medication List        Accurate as of November 16, 2022  7:06 AM. If you have any questions, ask your nurse or doctor.          Blisovi FE 1/20 1-20 MG-MCG tablet Generic drug: norethindrone-ethinyl estradiol-FE Take 1 tablet by mouth daily.   blood glucose meter kit and supplies Kit Dispense based on patient and insurance preference. Use up to four times daily as directed.   cetirizine 10 MG tablet Commonly known as: ZyrTEC Allergy Take 1 tablet (10 mg total) by mouth daily. What changed:  when to take this reasons to take this   Dexcom G7 Sensor Misc 1 Device by Does not apply route as directed.   escitalopram 10 MG tablet Commonly known as: LEXAPRO Take 1 tablet (10 mg total) by mouth daily.   hydrOXYzine 25 MG tablet Commonly known as: ATARAX Take 1 tablet (25 mg total) by mouth 3 (three) times daily as needed (Sleep).   insulin aspart protamine - aspart (70-30) 100 UNIT/ML FlexPen Commonly known as: NOVOLOG 70/30  MIX Inject 16 Units into the skin 2 (two) times daily.   Insulin Pen Needle 32G X 4 MM Misc 1 Device by Does not apply route in the morning and at bedtime.         ALLERGIES: No Known Allergies   REVIEW OF SYSTEMS: A comprehensive ROS was conducted with the patient and is negative except as per HPI   OBJECTIVE:   VITAL SIGNS: There were no vitals taken for this visit.   PHYSICAL EXAM:  General: Pt appears well and is in NAD  Neck: General: Supple without adenopathy or carotid bruits. Thyroid: Thyroid size normal.  No goiter or nodules appreciated.   Lungs: Clear  with good BS bilat with no rales, rhonchi, or wheezes  Heart: RRR   Abdomen:  soft, nontender  Extremities:  Lower extremities - No pretibial edema. No lesions.  Skin: Normal texture and temperature to palpation. No rash noted.  Neuro: MS is good with appropriate affect, pt is alert and Ox3    DM foot exam:    DATA REVIEWED:  Lab Results  Component Value Date   HGBA1C 13.3 (H) 08/30/2021   07/20/2022  A1c 12.2% BUN 13 Cr 0.740 GFR 119  ASSESSMENT / PLAN / RECOMMENDATIONS:   1) Type 1 Diabetes Mellitus, Poorly controlled, Without complications - Most recent A1c of 12.2 %. Goal A1c < 7.0 %.      -I have discussed with the patient the pathophysiology of diabetes. We went over the natural progression of the disease. We talked about both insulin resistance and insulin deficiency. I explained the complications associated with diabetes including retinopathy, nephropathy, neuropathy as well as increased risk of cardiovascular disease. We went over the benefit seen with glycemic control.   - I explained to the patient that diabetic patients are at higher than normal risk for amputations.  - We also discussed the difference between long and short acting insulin as well as difference between insulin analogues - This was very overwhelming for the pt  - Dexcom G7 sent , she was also given a sample of G7 sensors - Preconception counseling done, pt advised to avoid pregnancy unless A1c 7.0% or less, she is already on OCP's  - Not interested in pump technology at this time  - We discussed in an ideal situation would change to prandial/basal but given how overwhelmed she was, I opted to keep this for another discussion - We also discussed insulin sensitivity in T1DM and hypoglycemia, a prescription for Gvoke was sent and I have demonstrated proper use of pen to the family member today   MEDICATIONS: Increase Novolog Mix to 16 units before Breakfast and 16 units with Supper   EDUCATION /  INSTRUCTIONS: BG monitoring instructions: Patient is instructed to check her blood sugars 3 times a day, before each meal  Call [facility] if: BG persistently < 70  I reviewed the Rule of 15 for the treatment of hypoglycemia in detail with the patient. Literature supplied.   2) Diabetic complications:  Eye: Does not have known diabetic retinopathy.  Neuro/ Feet: Does not have known diabetic peripheral neuropathy. Renal: Patient does not  have known baseline CKD. She is not on an ACEI/ARB at present.    F/U in 3 months      Signed electronically by: Mack Guise, MD  [facility] End[facility]  [address] [place], [place] [address] Phone: [phone] FAX: [phone]   CC: Pcp, No No address  on file Phone: None  Fax: None    Return to Endocrinology clinic as below: Future Appointments  Date Time Provider [place]  11/16/2022  8:10 AM Madelaine Whipple, Melanie Crazier, MD [facility] None

## 2022-12-20 ENCOUNTER — Encounter: Payer: Self-pay | Admitting: Oncology

## 2022-12-20 ENCOUNTER — Ambulatory Visit (INDEPENDENT_AMBULATORY_CARE_PROVIDER_SITE_OTHER): Payer: BC Managed Care – PPO | Admitting: Oncology

## 2022-12-20 ENCOUNTER — Other Ambulatory Visit: Payer: Self-pay

## 2022-12-20 VITALS — BP 102/78 | HR 98 | Temp 98.2°F | Ht 67.01 in | Wt 140.0 lb

## 2022-12-20 DIAGNOSIS — Z113 Encounter for screening for infections with a predominantly sexual mode of transmission: Secondary | ICD-10-CM | POA: Diagnosis not present

## 2022-12-20 DIAGNOSIS — N898 Other specified noninflammatory disorders of vagina: Secondary | ICD-10-CM | POA: Diagnosis not present

## 2022-12-20 MED ORDER — FLUCONAZOLE 150 MG PO TABS: 150.0000 mg | ORAL_TABLET | Freq: Once | ORAL | 0 refills | Status: AC

## 2022-12-20 NOTE — Progress Notes (Signed)
[address] [place], [place] [address] Phone: [phone] Fax: [phone]   Office Visit Note  Patient Name: Kristina Sparks  Date of [id number]  Med Rec number [id number]  Date of Service: 12/20/2022  Patient has no known allergies.  No chief complaint on file.  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 Feels itchy. Thick white vaginal discharge. Has tried monistat otc with temporary relief. Just came back to school after taking Fall semester off. Feels like she is not controlling blood sugars as well as she should. Has new insulin pump that she is trying to get use too.   Had previous yeast infection back in Sept 2022 and sx feel similar.  Has history of type I DM-diagnosed Oct 2022. A1c 13.3 at admission.   Would also like routine STI screening.  No current concerning symptoms.  Current Medication:  Outpatient Encounter Medications as of 12/20/2022  Medication Sig   BLISOVI FE 1/20 1-20 MG-MCG tablet Take 1 tablet by mouth daily.   blood glucose meter kit and supplies KIT Dispense based on patient and insurance preference. Use up to four times daily as directed.   cetirizine (ZYRTEC ALLERGY) 10 MG tablet Take 1 tablet (10 mg total) by mouth daily. (Patient taking differently: Take 10 mg by mouth daily as needed for allergies.)   Continuous Blood Gluc Sensor (DEXCOM G7 SENSOR) MISC 1 Device by Does not apply route as directed.   escitalopram (LEXAPRO) 10 MG tablet Take 1 tablet (10 mg total) by mouth daily.   hydrOXYzine (ATARAX) 25 MG tablet Take 1 tablet (25 mg total) by mouth 3 (three) times daily as needed (Sleep).   insulin aspart protamine - aspart (NOVOLOG 70/30 MIX) (70-30) 100 UNIT/ML FlexPen Inject 16 Units into the skin 2 (two) times daily.   Insulin Pen Needle 32G X 4 MM MISC 1 Device by Does not apply route in the morning and at bedtime.   No facility-administered encounter medications on file as of  12/20/2022.     Medical History: Past Medical History:  Diagnosis Date   Diabetes mellitus type 1, uncomplicated ([place])     Vital Signs: There were no vitals taken for this visit.  ROS: As per HPI.  All other pertinent ROS negative.     Review of Systems  Genitourinary:  Positive for vaginal discharge.    Physical Exam Constitutional:      Appearance: Normal appearance.  Neurological:     Mental Status: She is alert and oriented to person, place, and time.     No results found for this or any previous visit (from the past 24 hour(s)).  Assessment/Plan: 1. Encounter for screening for infections with a predominantly sexual mode of transmission -Routine screening for STIs.  Will send results via MyChart when available.  - Chlamydia/Gonococcus/Trichomonas, NAA  2. Vaginal discharge -Symptoms consistent with yeast infection.  Given past medical history, will treat with Diflucan x 1 dose.  May repeat in 72 hours should symptoms not resolve.  If no improvement after 2 doses, recommend examination.   - fluconazole (DIFLUCAN) 150 MG tablet; Take 1 tablet (150 mg total) by mouth once for 1 dose.  Dispense: 2 tablet; Refill: 0  Disposition-return to clinic as needed.  General Counseling: Afsa verbalizes understanding of the findings of todays visit and agrees with plan of treatment. I have discussed any further diagnostic evaluation that may be needed or ordered today. We also reviewed her medications  today. she has been encouraged to call the office with any questions or concerns that should arise related to todays visit.   No orders of the defined types were placed in this encounter.   No orders of the defined types were placed in this encounter.   I spent 20 minutes dedicated to the care of this patient (face-to-face and non-face-to-face) on the date of the encounter to include what is described in the assessment and plan.   Faythe Casa, NP 12/20/2022 2:05 PM

## 2022-12-23 LAB — CHLAMYDIA/GONOCOCCUS/TRICHOMONAS, NAA
Chlamydia by NAA: NEGATIVE
Gonococcus by NAA: NEGATIVE
Trich vag by NAA: NEGATIVE

## 2023-01-03 ENCOUNTER — Other Ambulatory Visit: Payer: Self-pay | Admitting: Oncology

## 2023-01-03 DIAGNOSIS — N898 Other specified noninflammatory disorders of vagina: Secondary | ICD-10-CM

## 2023-02-09 ENCOUNTER — Other Ambulatory Visit: Payer: Self-pay | Admitting: Nurse Practitioner

## 2023-02-09 DIAGNOSIS — E1065 Type 1 diabetes mellitus with hyperglycemia: Secondary | ICD-10-CM

## 2023-02-09 NOTE — Telephone Encounter (Signed)
Requested by interface surescripts. Medication dose discontinued 07/27/22. Requested Prescriptions  Refused Prescriptions Disp Refills   NOVOLIN 70/30 KWIKPEN (70-30) 100 UNIT/ML KwikPen [Pharmacy Med Name: NOVOLIN 70/30 U-100 FLEXPEN INJ 3ML] 15 mL 11    Sig: ADMINISTER 12 UNITS UNDER THE SKIN TWICE DAILY     Endocrinology:  Diabetes - Insulins Failed - 02/09/2023 12:00 PM      Failed - HBA1C is between 0 and 7.9 and within 180 days    Hgb A1c MFr Bld  Date Value Ref Range Status  08/30/2021 13.3 (H) 4.8 - 5.6 % Final    Comment:    (NOTE) Pre diabetes:          5.7%-6.4%  Diabetes:              >6.4%  Glycemic control for   <7.0% adults with diabetes          Failed - Valid encounter within last 6 months    Recent Outpatient Visits           1 year ago Type 1 diabetes mellitus with hyperglycemia [facility])   [place], Vernia Buff, NP

## 2023-02-21 ENCOUNTER — Ambulatory Visit (INDEPENDENT_AMBULATORY_CARE_PROVIDER_SITE_OTHER): Payer: BC Managed Care – PPO | Admitting: Family Medicine

## 2023-02-21 ENCOUNTER — Encounter: Payer: Self-pay | Admitting: Family Medicine

## 2023-02-21 VITALS — BP 122/70 | HR 91 | Temp 97.3°F | Wt 144.0 lb

## 2023-02-21 DIAGNOSIS — E1065 Type 1 diabetes mellitus with hyperglycemia: Secondary | ICD-10-CM

## 2023-02-21 LAB — POCT URINALYSIS DIPSTICK (MANUAL)
Leukocytes, UA: NEGATIVE
Nitrite, UA: NEGATIVE
Poct Bilirubin: NEGATIVE
Poct Blood: NEGATIVE
Poct Glucose: 1000 mg/dL — AB
Poct Protein: NEGATIVE mg/dL
Poct Urobilinogen: NORMAL mg/dL
Spec Grav, UA: 1.015 (ref 1.010–1.025)
pH, UA: 5 (ref 5.0–8.0)

## 2023-02-21 NOTE — Progress Notes (Signed)
[address] [place], [place] [address] Phone: [phone] Fax: [phone]   Office Visit Note  Patient Name: Kristina Sparks  Date of [id number]  Med Rec number [id number]  Date of Service: 02/21/2023  Patient has no known allergies.  Chief Complaint  Patient presents with   Diabetes     Student emailed me this afternoon due to concern that she may need to go to the hospital for her diabetes. 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 Saw endocrinologist last year but was not a good experience and she has therefore not followed up. After that visit she felt overwhelmed and that she would never be able to manage her diabetes. She did have a good experience with the dietician there but the guidelines around meals are somewhat difficult to navigate on a [employer]  Currently using 16U 70/30 Novolog with first meal of the day; 16U with the evening meal - was apparently started on this regimen so has stayed on it. Has noticed that if she gets a significant low it tends to be early mornings. Skips breakfast every other day due to class schedules - on those days does not use her insulin until 2 pm when she has a meal  Has no energy - feels sluggish, brain fog, needs to get through the semester Feels she spends too much time inside but stamina reduced as regards exercise - is aware that exercise can help with her blood sugar control Is getting tingling in hands intermittently   Diabetes Associated symptoms include fatigue.      Current Medication:  Outpatient Encounter Medications as of 02/21/2023  Medication Sig   BLISOVI FE 1/20 1-20 MG-MCG tablet Take 1  tablet by mouth daily.   blood glucose meter kit and supplies KIT Dispense based on patient and insurance preference. Use up to four times daily as directed.   cetirizine (ZYRTEC ALLERGY) 10 MG tablet Take 1 tablet (10 mg total) by mouth daily. (Patient taking differently: Take 10 mg by mouth daily as needed for allergies.)   Continuous Blood Gluc Sensor (DEXCOM G7 SENSOR) MISC 1 Device by Does not apply route as directed.   escitalopram (LEXAPRO) 10 MG tablet Take 1 tablet (10 mg total) by mouth daily.   hydrOXYzine (ATARAX) 25 MG tablet Take 1 tablet (25 mg total) by mouth 3 (three) times daily as needed (Sleep).   insulin aspart protamine - aspart (NOVOLOG 70/30 MIX) (70-30) 100 UNIT/ML FlexPen Inject 16 Units into the skin 2 (two) times daily.   Insulin Pen Needle 32G X 4 MM MISC 1 Device by Does not apply route in the morning and at bedtime.   No facility-administered encounter medications on file as of 02/21/2023.      Medical History: Past Medical History:  Diagnosis Date   Diabetes mellitus type 1, uncomplicated      Vital Signs: BP 122/70   Pulse 91   Temp (!) 97.3 F (36.3 C)   Wt 144 lb (65.3 kg)   SpO2 98%   BMI 22.55 kg/m    Review of Systems  Constitutional:  Positive for fatigue.  Genitourinary:  Negative for enuresis.  Physical Exam Vitals reviewed.  Constitutional:      General: She is not in acute distress.    Appearance: She is not ill-appearing.  Psychiatric:        Mood and Affect: Mood normal.        Behavior: Behavior normal.        Thought Content: Thought content normal.        Judgment: Judgment normal.       Assessment/Plan:  1. Type 1 diabetes mellitus with hyperglycemia  - POCT Urinalysis Dip Manual - Ambulatory referral to Endocrinology    Kristina Sparks will contact [facility] to see if there is financial assistance available for her supplies We will change the insulin dosage to 20U in the morning and 10 in the evening On days  she cannot make it to breakfast in the dining hall she will have a breakfast sandwich at home I will refer to [facility] endocrinology and also do a letter for [employer] and her professors at [employer] will follow up with me on Friday of this week  General Counseling: Kristina Sparks verbalizes understanding of the findings of todays visit and agrees with plan of treatment. I have discussed any further diagnostic evaluation that may be needed or ordered today. We also reviewed her medications today. she has been encouraged to call the office with any questions or concerns that should arise related to todays visit.   No orders of the defined types were placed in this encounter.   No orders of the defined types were placed in this encounter.  Time spent was a total of 55 minutes including time with the patient during the visit, determining appropriate specialist and providing paperwork for specialist and for [facility]  Dr Evette Doffing [name] [facility] Physician

## 2023-02-22 ENCOUNTER — Encounter: Payer: Self-pay | Admitting: Family Medicine

## 2023-02-22 ENCOUNTER — Other Ambulatory Visit: Payer: Self-pay | Admitting: Family Medicine

## 2023-02-22 DIAGNOSIS — E1065 Type 1 diabetes mellitus with hyperglycemia: Secondary | ICD-10-CM

## 2023-02-24 ENCOUNTER — Ambulatory Visit (INDEPENDENT_AMBULATORY_CARE_PROVIDER_SITE_OTHER): Payer: BC Managed Care – PPO | Admitting: Family Medicine

## 2023-02-24 ENCOUNTER — Encounter: Payer: Self-pay | Admitting: Family Medicine

## 2023-02-24 VITALS — BP 110/72 | HR 112 | Temp 97.3°F

## 2023-02-24 DIAGNOSIS — E1065 Type 1 diabetes mellitus with hyperglycemia: Secondary | ICD-10-CM

## 2023-02-24 DIAGNOSIS — E639 Nutritional deficiency, unspecified: Secondary | ICD-10-CM | POA: Diagnosis not present

## 2023-02-24 NOTE — Progress Notes (Unsigned)
[facility] [address] [place], [place] [address] Phone: [phone] Fax: [phone]   Office Visit Note  Patient Name: Kristina Sparks  Date of IRWER:[id number]  Med Rec number [id number]  Date of Service: 02/24/2023  Patient has no known allergies.  Chief Complaint  Patient presents with   Follow-up     Here for followup of diabetes  Since chnaging insulin to 20/10 from 16 bid has stopped having morining swetas, anxiety, and shaking Feels less sluggish during the day and doe snotJune 10 1:40 pm      Current Medication:  Outpatient Encounter Medications as of 02/24/2023  Medication Sig   BLISOVI FE 1/20 1-20 MG-MCG tablet Take 1 tablet by mouth daily.   blood glucose meter kit and supplies KIT Dispense based on patient and insurance preference. Use up to four times daily as directed.   cetirizine (ZYRTEC ALLERGY) 10 MG tablet Take 1 tablet (10 mg total) by mouth daily. (Patient taking differently: Take 10 mg by mouth daily as needed for allergies.)   Continuous Blood Gluc Sensor (DEXCOM G7 SENSOR) MISC 1 Device by Does not apply route as directed.   escitalopram (LEXAPRO) 10 MG tablet Take 1 tablet (10 mg total) by mouth daily.   hydrOXYzine (ATARAX) 25 MG tablet Take 1 tablet (25 mg total) by mouth 3 (three) times daily as needed (Sleep).   insulin aspart protamine - aspart (NOVOLOG 70/30 MIX) (70-30) 100 UNIT/ML FlexPen Inject 16 Units into the skin 2 (two) times daily.   Insulin Pen Needle 32G X 4 MM MISC 1 Device by Does not apply route in the morning and at bedtime.   No facility-administered encounter medications on file as of 02/24/2023.      Medical History: Past Medical History:  Diagnosis Date   Diabetes mellitus type 1, uncomplicated      Vital Signs: There were no vitals taken for this visit.   Review of Systems  Physical Exam    Assessment/Plan:   General Counseling: [name] verbalizes understanding of the findings of todays visit  and agrees with plan of treatment. I have discussed any further diagnostic evaluation that may be needed or ordered today. We also reviewed her medications today. she has been encouraged to call the office with any questions or concerns that should arise related to todays visit.   No orders of the defined types were placed in this encounter.   No orders of the defined types were placed in this encounter.    Dr Durwin Reges Aimi Essner ABFM [employer] Physician

## 2023-02-25 ENCOUNTER — Encounter: Payer: Self-pay | Admitting: Family Medicine

## 2023-02-25 LAB — POCT URINALYSIS DIPSTICK (MANUAL)
Leukocytes, UA: NEGATIVE
Nitrite, UA: NEGATIVE
Poct Bilirubin: NEGATIVE
Poct Blood: NEGATIVE
Poct Glucose: 1000 mg/dL — AB
Poct Urobilinogen: NORMAL mg/dL
Spec Grav, UA: 1.02 (ref 1.010–1.025)
pH, UA: 5 (ref 5.0–8.0)

## 2023-02-27 LAB — COMP. METABOLIC PANEL (14)
ALT: 18 IU/L (ref 0–32)
AST: 13 IU/L (ref 0–40)
Albumin/Globulin Ratio: 1.4 (ref 1.2–2.2)
Albumin: 4 g/dL (ref 4.0–5.0)
Alkaline Phosphatase: 63 IU/L (ref 42–106)
BUN/Creatinine Ratio: 13 (ref 9–23)
BUN: 10 mg/dL (ref 6–20)
Bilirubin Total: <0.2 mg/dL (ref 0.0–1.2)
CO2: 17 mmol/L — ABNORMAL LOW (ref 20–29)
Calcium: 8.8 mg/dL (ref 8.7–10.2)
Chloride: 103 mmol/L (ref 96–106)
Creatinine, Ser: 0.75 mg/dL (ref 0.57–1.00)
Globulin, Total: 2.8 g/dL (ref 1.5–4.5)
Glucose: 330 mg/dL — ABNORMAL HIGH (ref 70–99)
Potassium: 3.9 mmol/L (ref 3.5–5.2)
Sodium: 137 mmol/L (ref 134–144)
Total Protein: 6.8 g/dL (ref 6.0–8.5)
eGFR: 117 mL/min/{1.73_m2} (ref >59)

## 2023-02-27 LAB — VITAMIN D 25 HYDROXY (VIT D DEFICIENCY, FRACTURES): Vit D, 25-Hydroxy: 14.1 ng/mL — ABNORMAL LOW (ref 30.0–100.0)

## 2023-02-27 LAB — HEMOGLOBIN A1C
Est. average glucose Bld gHb Est-mCnc: >398 mg/dL
Hgb A1c MFr Bld: >15.5 % — ABNORMAL HIGH (ref 4.8–5.6)

## 2023-03-13 ENCOUNTER — Other Ambulatory Visit: Payer: Self-pay | Admitting: Family Medicine

## 2023-03-13 ENCOUNTER — Encounter: Payer: Self-pay | Admitting: Family Medicine

## 2023-03-13 DIAGNOSIS — B3731 Acute candidiasis of vulva and vagina: Secondary | ICD-10-CM

## 2023-03-13 DIAGNOSIS — E1065 Type 1 diabetes mellitus with hyperglycemia: Secondary | ICD-10-CM

## 2023-03-13 MED ORDER — FLUCONAZOLE 150 MG PO TABS: 150.0000 mg | ORAL_TABLET | ORAL | 0 refills | Status: DC

## 2023-04-09 ENCOUNTER — Encounter: Payer: Self-pay | Admitting: Family Medicine

## 2023-04-10 ENCOUNTER — Encounter: Payer: Self-pay | Admitting: Family Medicine

## 2023-04-10 ENCOUNTER — Ambulatory Visit (INDEPENDENT_AMBULATORY_CARE_PROVIDER_SITE_OTHER): Payer: BC Managed Care – PPO | Admitting: Family Medicine

## 2023-04-10 VITALS — BP 106/62 | HR 89 | Temp 98.8°F | Wt 144.0 lb

## 2023-04-10 DIAGNOSIS — E1065 Type 1 diabetes mellitus with hyperglycemia: Secondary | ICD-10-CM | POA: Diagnosis not present

## 2023-04-10 DIAGNOSIS — E559 Vitamin D deficiency, unspecified: Secondary | ICD-10-CM | POA: Diagnosis not present

## 2023-04-10 DIAGNOSIS — Z3041 Encounter for surveillance of contraceptive pills: Secondary | ICD-10-CM | POA: Diagnosis not present

## 2023-04-10 DIAGNOSIS — B3731 Acute candidiasis of vulva and vagina: Secondary | ICD-10-CM | POA: Diagnosis not present

## 2023-04-10 DIAGNOSIS — F5089 Other specified eating disorder: Secondary | ICD-10-CM

## 2023-04-10 MED ORDER — NORGESTIMATE-ETH ESTRADIOL 0.25-35 MG-MCG PO TABS
1.0000 | ORAL_TABLET | Freq: Every day | ORAL | 3 refills | Status: DC
Start: 2023-04-10 — End: 2024-07-17

## 2023-04-10 MED ORDER — FLUCONAZOLE 150 MG PO TABS
150.0000 mg | ORAL_TABLET | ORAL | 0 refills | Status: DC
Start: 2023-04-10 — End: 2023-07-05

## 2023-04-10 NOTE — Progress Notes (Signed)
[facility] [address] [place], [place] [address] Phone: [phone] Fax: [phone]   Office Visit Note  Patient Name: Kristina Sparks  Date of [id number]  Med Rec number [id number]  Date of Service: 04/10/2023  Patient has no known allergies.  Chief Complaint  Patient presents with   Contraception     See prior OV notes and recent secure messages  Used to see a gyn in [place] - was unable to get in contact with them and has been out of her pills since Sunday last week Was taking MILI - regular menses, no emotional issues, took Blisovi before that but bleeding really heavy with that No headaches In a monogamous relationship - does not use condoms Yeast infection symptoms fluctuate with her blood sugars  Getting some constipation - drinks lots of water, has hard stool with pain when she passes it and some blood when she wipes (possible fissure)  Sleep and diet not great 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 Had three finals - does not have the grade for the third. Failed the other two but passed the courses  Has been experiencing craving for ice in the form of slushies and wondering about her iron levels  Has her first appointment with [facility] diabetes team on June 10th Declines blood work today but is aware she will need to have blood work done then      Current Medication:  Outpatient Encounter Medications as of 04/10/2023  Medication Sig   BLISOVI FE 1/20 1-20 MG-MCG tablet  Take 1 tablet by mouth daily.   blood glucose meter kit and supplies KIT Dispense based on patient and insurance preference. Use up to four times daily as directed.   cetirizine (ZYRTEC ALLERGY) 10 MG tablet Take 1 tablet (10 mg total) by mouth daily. (Patient taking differently: Take 10 mg by mouth daily as needed for allergies.)   Continuous Blood Gluc Sensor (DEXCOM G7 SENSOR) MISC 1 Device by Does not apply route as directed.   escitalopram (LEXAPRO) 10 MG tablet Take 1 tablet (10 mg total) by mouth daily.   fluconazole (DIFLUCAN) 150 MG tablet Take 1 tablet (150 mg total) by mouth once a week.   hydrOXYzine (ATARAX) 25 MG tablet Take 1 tablet (25 mg total) by mouth 3 (three) times daily as needed (Sleep).   insulin aspart protamine - aspart (NOVOLOG 70/30 MIX) (70-30) 100 UNIT/ML FlexPen Inject 16 Units into the skin 2 (two) times daily. (Patient taking differently: Inject 16 Units into the skin 2 (two) times daily. Taking 20 Units QAM and 10 Units in PM)   Insulin Pen Needle 32G X 4 MM MISC 1 Device by Does not apply route in the morning and at bedtime.   No facility-administered encounter medications on file as of 04/10/2023.      Medical History: Past Medical History:  Diagnosis Date   Diabetes mellitus type 1, uncomplicated (HCC)    Vitamin D deficiency  Vital Signs: BP 106/62   Pulse 89   Temp 98.8 F (37.1 C) (Tympanic)   Wt 144 lb (65.3 kg)   SpO2 98%   BMI 22.55 kg/m    Review of Systems  Constitutional:  Positive for fatigue.  Endocrine: Positive for cold intolerance.  Genitourinary:  Positive for menstrual problem and vaginal discharge.    Physical Exam Vitals reviewed.  Constitutional:      Appearance: Normal appearance.  Neck:     Thyroid: No thyroid mass, thyromegaly or thyroid tenderness.  Cardiovascular:     Rate and Rhythm: Normal rate and regular rhythm.  Pulmonary:     Effort: Pulmonary effort is normal.     Breath sounds: Normal breath  sounds and air entry.  Abdominal:     General: Abdomen is flat.     Palpations: Abdomen is soft. There is no hepatomegaly or splenomegaly.     Tenderness: There is no abdominal tenderness.  Neurological:     Mental Status: She is alert.  Psychiatric:        Attention and Perception: Attention normal.        Mood and Affect: Mood normal.        Behavior: Behavior is cooperative.     Assessment/Plan:  1. Encounter for birth control pills maintenance Start tomorrow morning and use condoms for 4 weeks - norgestimate-ethinyl estradiol (MILI) 0.25-35 MG-MCG tablet; Take 1 tablet by mouth daily.  Dispense: 84 tablet; Refill: 3  2. Candida vaginitis  - fluconazole (DIFLUCAN) 150 MG tablet; Take 1 tablet (150 mg total) by mouth once a week.  Dispense: 8 tablet; Refill: 0  3. Type 1 diabetes mellitus with hyperglycemia (HCC) Follow-up with [facility], defer labs till then  4. Vitamin D deficiency Continue 4,000 IU daily  5. Pica Needs iron levels checked - will discuss when she is seen at [facility], prefers to defer for now      General Counseling: Roderick verbalizes understanding of the findings of todays visit and agrees with plan of treatment. I have discussed any further diagnostic evaluation that may be needed or ordered today. We also reviewed her medications today. she has been encouraged to call the office with any questions or concerns that should arise related to todays visit.   No orders of the defined types were placed in this encounter.   No orders of the defined types were placed in this encounter.     Dr Durwin Reges Boruch Manuele ABFM University Physician

## 2023-05-01 DIAGNOSIS — E559 Vitamin D deficiency, unspecified: Secondary | ICD-10-CM | POA: Diagnosis not present

## 2023-05-01 DIAGNOSIS — E1065 Type 1 diabetes mellitus with hyperglycemia: Secondary | ICD-10-CM | POA: Diagnosis not present

## 2023-06-14 DIAGNOSIS — J069 Acute upper respiratory infection, unspecified: Secondary | ICD-10-CM | POA: Diagnosis not present

## 2023-07-05 ENCOUNTER — Encounter: Payer: Self-pay | Admitting: Family Medicine

## 2023-07-05 ENCOUNTER — Ambulatory Visit (INDEPENDENT_AMBULATORY_CARE_PROVIDER_SITE_OTHER): Payer: BC Managed Care – PPO | Admitting: Family Medicine

## 2023-07-05 ENCOUNTER — Other Ambulatory Visit: Payer: Self-pay

## 2023-07-05 VITALS — BP 118/61 | HR 100 | Temp 97.9°F | Ht 67.72 in | Wt 140.0 lb

## 2023-07-05 DIAGNOSIS — B3731 Acute candidiasis of vulva and vagina: Secondary | ICD-10-CM

## 2023-07-05 DIAGNOSIS — K625 Hemorrhage of anus and rectum: Secondary | ICD-10-CM | POA: Diagnosis not present

## 2023-07-05 DIAGNOSIS — E1065 Type 1 diabetes mellitus with hyperglycemia: Secondary | ICD-10-CM

## 2023-07-05 DIAGNOSIS — K5902 Outlet dysfunction constipation: Secondary | ICD-10-CM | POA: Diagnosis not present

## 2023-07-05 DIAGNOSIS — E559 Vitamin D deficiency, unspecified: Secondary | ICD-10-CM

## 2023-07-05 MED ORDER — FLUCONAZOLE 150 MG PO TABS
150.0000 mg | ORAL_TABLET | ORAL | 3 refills | Status: DC
Start: 2023-07-05 — End: 2024-07-17

## 2023-07-05 NOTE — Progress Notes (Signed)
[facility] [address] [place], [place] [address] Phone: [phone] Fax: [phone]   Office Visit Note  Patient Name: Kristina Sparks  Date of [id number]  Med Rec number [id number]  Date of Service: 07/05/2023  Patient has no known allergies.  Chief Complaint  Patient presents with   Abdominal Pain     Having issues with constipation, stomach pain, bleeding with stool Blood when wipes,  Pain with defecation, bloating only related to menstrual cycle Stool hard at first then normal/soft  Drinks appropriate amount of water, diet good  Has Type 1 DM but only 2 year history so gastroparesis not a concern  Currently supposed to be using Novolog 70/30 16 units twice daily but insurance no longer covers this so has reduced her dose to make it last Does not yet have her Dexcom or her Omnipump supplies so does not know what her sugars have been Was seen at [facility] and had positive experience and will be following there for diabetes management.   Does have some fatigue and some itchy vaginal discharge (white, no odor) - CBC from June shows no anemia. 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   blood glucose meter kit and supplies KIT Dispense based on patient and insurance preference. Use up to four times daily as directed. (Patient not taking: Reported on 07/05/2023)   cetirizine (ZYRTEC ALLERGY) 10 MG tablet Take 1 tablet (10 mg total) by mouth daily. (Patient taking differently: Take 10 mg by mouth daily as needed for allergies.)   Continuous Blood Gluc Sensor (DEXCOM G7 SENSOR) MISC 1 Device by Does not apply route as directed. (Patient not taking: Reported on 07/05/2023)   insulin aspart  protamine - aspart (NOVOLOG 70/30 MIX) (70-30) 100 UNIT/ML FlexPen Inject 16 Units into the skin 2 (two) times daily. (Patient taking differently: Inject 16 Units into the skin 2 (two) times daily. Taking 20 Units QAM and 10 Units in PM)   norgestimate-ethinyl estradiol (MILI) 0.25-35 MG-MCG tablet Take 1 tablet by mouth daily.   [DISCONTINUED] escitalopram (LEXAPRO) 10 MG tablet Take 1 tablet (10 mg total) by mouth daily.   [DISCONTINUED] fluconazole (DIFLUCAN) 150 MG tablet Take 1 tablet (150 mg total) by mouth once a week.   [DISCONTINUED] fluconazole (DIFLUCAN) 150 MG tablet Take 1 tablet (150 mg total) by mouth once a week.   [DISCONTINUED] hydrOXYzine (ATARAX) 25 MG tablet Take 1 tablet (25 mg total) by mouth 3 (three) times daily as needed (Sleep).   [DISCONTINUED] Insulin Pen Needle 32G X 4 MM MISC 1 Device by Does not apply route in the morning and at bedtime.   No facility-administered encounter medications on file as of 07/05/2023.      Medical History: Past Medical History:  Diagnosis Date   Diabetes mellitus type 1, uncomplicated (HCC)    Vitamin D deficiency      Vital Signs: BP 118/61   Pulse 100   Temp 97.9 F (36.6 C) (Tympanic)   Ht 5' 7.72" (1.72 m)   Wt 140 lb (63.5 kg)   BMI 21.47 kg/m    Review of Systems  Constitutional:  Positive for fatigue.  Gastrointestinal:  Positive for anal bleeding, constipation and rectal  pain. Negative for abdominal distention.  Genitourinary:  Positive for vaginal discharge.    Physical Exam Vitals reviewed.  Constitutional:      General: She is not in acute distress. Abdominal:     General: Abdomen is flat. Bowel sounds are normal.     Palpations: Abdomen is soft. There is no hepatomegaly or splenomegaly.     Tenderness: There is no abdominal tenderness.  Genitourinary:    Exam position: Knee-chest position.       Comments: White discharge with macerated skin from perineum and two small peri-anal tears No skin tags,  slight protuberance of anal vein at 6 o'clock Neurological:     Mental Status: She is alert.    Assessment/Plan:  1. Candida vaginitis Take for 4 weeks - fluconazole (DIFLUCAN) 150 MG tablet; Take 1 tablet (150 mg total) by mouth once a week.  Dispense: 4 tablet; Refill: 3  2. Rectal bleeding Due to macerated skin resulting in superficial tear causing pain and bleeding  Use zinc oxide containing diaper rash cream to protect skin while treating yeast infection - apply to perineum and anus after showering, at bedtime, and after bowel movements  3. 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 Type 1 diabetes mellitus with hyperglycemia (HCC) Kristina Sparks will check with her insurance to see what equivalent insulin they will cover and she will let me know so I can send new prescription to her pharamcy for her.She will also contact Chaplain's office again regarding financial assistance to cover her Dexcom G7 and Omnipump  5. Vitamin D deficiency Contain oral Vitamin D3    General Counseling: Kristina Sparks verbalizes understanding of the findings of todays visit and agrees with plan of treatment. I have discussed any further diagnostic evaluation that may be needed or ordered today. We also reviewed her medications today. she has been encouraged to call the office with any questions or concerns that should arise related to todays visit.  Kristina Sparks will follow-up in 2 weeks for review and for her first Pap smear which we discussed today Email sent to her work supervisor documenting her illness and office visit today No orders of the defined types were placed in this encounter.   No orders of the defined types were placed in this encounter.    Dr Durwin Reges Kristina Sparks Kristina Sparks ABFM University Physician

## 2023-07-20 ENCOUNTER — Other Ambulatory Visit: Payer: Self-pay

## 2023-07-20 ENCOUNTER — Ambulatory Visit (INDEPENDENT_AMBULATORY_CARE_PROVIDER_SITE_OTHER): Payer: BC Managed Care – PPO | Admitting: Family Medicine

## 2023-07-20 ENCOUNTER — Encounter: Payer: Self-pay | Admitting: Family Medicine

## 2023-07-20 VITALS — BP 99/60 | HR 87 | Temp 98.6°F | Wt 140.0 lb

## 2023-07-20 DIAGNOSIS — Z124 Encounter for screening for malignant neoplasm of cervix: Secondary | ICD-10-CM

## 2023-07-20 DIAGNOSIS — J069 Acute upper respiratory infection, unspecified: Secondary | ICD-10-CM | POA: Diagnosis not present

## 2023-07-20 LAB — POC COVID19 BINAXNOW: SARS Coronavirus 2 Ag: NEGATIVE

## 2023-07-20 NOTE — Progress Notes (Signed)
[facility] [address] [place], [place] [address] Phone: [phone] Fax: [phone]   Office Visit Note  Patient Name: Kristina Sparks  Date of [id number]  Med Rec number [id number]  Date of Service: 07/20/2023  Patient has no known allergies.  Chief Complaint  Patient presents with   Gynecologic Exam     See last OV notes  Also currently has sinus congestion and concerned for COVID or allergies  Missed one pill in last pack and now having some bleeding which started yesterday  Using her insulin as prescribed, not experiencing hypos, energy level constant  Gynecologic Exam Pertinent negatives include no fever.      Current Medication:  Outpatient Encounter Medications as of 07/20/2023  Medication Sig   blood glucose meter kit and supplies KIT Dispense based on patient and insurance preference. Use up to four times daily as directed. (Patient not taking: Reported on 07/05/2023)   cetirizine (ZYRTEC ALLERGY) 10 MG tablet Take 1 tablet (10 mg total) by mouth daily. (Patient taking differently: Take 10 mg by mouth daily as needed for allergies.)   Cholecalciferol (D3 ADULT PO) Take by mouth.   Continuous Blood Gluc Sensor (DEXCOM G7 SENSOR) MISC 1 Device by Does not apply route as directed. (Patient not taking: Reported on 07/05/2023)   fluconazole (DIFLUCAN) 150 MG tablet Take 1 tablet (150 mg total) by mouth once a week.   insulin aspart protamine - aspart (NOVOLOG 70/30 MIX) (70-30) 100 UNIT/ML FlexPen Inject 16 Units into the skin 2 (two) times daily. (Patient taking differently: Inject 16 Units into the skin 2 (two) times daily. Taking 20 Units QAM and 10 Units in PM)   norgestimate-ethinyl estradiol (MILI) 0.25-35 MG-MCG tablet Take 1 tablet by mouth daily.   No facility-administered encounter medications on file as of 07/20/2023.      Medical History: Past Medical History:  Diagnosis Date   Diabetes mellitus type 1, uncomplicated (HCC)     Vitamin D deficiency      Vital Signs: BP 99/60   Pulse 87   Temp 98.6 F (37 C) (Tympanic)   Wt 140 lb (63.5 kg) Comment: per chart  SpO2 98%   BMI 21.47 kg/m    Review of Systems  Constitutional:  Negative for fever.  HENT:  Positive for congestion and sinus pressure.   Genitourinary:  Positive for vaginal bleeding.    Physical Exam Vitals reviewed.  Constitutional:      Appearance: Normal appearance.  HENT:     Right Ear: Tympanic membrane normal.     Left Ear: Tympanic membrane normal.     Nose: Rhinorrhea present. Rhinorrhea is clear.     Right Turbinates: Swollen.     Left Turbinates: Swollen.     Right Sinus: No maxillary sinus tenderness or frontal sinus tenderness.     Left Sinus: No maxillary sinus tenderness or frontal sinus tenderness.     Mouth/Throat:     Mouth: Mucous membranes are moist.     Pharynx: Oropharynx is clear. Uvula midline. No posterior oropharyngeal erythema.  Eyes:     Conjunctiva/sclera: Conjunctivae normal.  Pulmonary:     Effort: Pulmonary effort is normal.     Breath sounds: Normal breath sounds and air entry.  Genitourinary:    General: Normal vulva.     Exam position: Lithotomy position.     Vagina: Normal.     Cervix: Normal.     Comments: Menstrual blood in vault Lymphadenopathy:     Cervical:  No cervical adenopathy.  Neurological:     Mental Status: She is alert.     Assessment/Plan: 1. Encounter for Papanicolaou smear for cervical cancer screening  - Pap IG (Image Guided)  2. Acute upper respiratory infection Results for orders placed or performed in visit on 07/20/23 (from the past 24 hour(s))  POC COVID-19     Status: Normal   Collection Time: 07/20/23  1:33 PM  Result Value Ref Range   SARS Coronavirus 2 Ag Negative Negative    - POC COVID-19     General Counseling: Luretta verbalizes understanding of the findings of todays visit and agrees with plan of treatment. I have discussed any further diagnostic  evaluation that may be needed or ordered today. We also reviewed her medications today. she has been encouraged to call the office with any questions or concerns that should arise related to todays visit.   No orders of the defined types were placed in this encounter.   No orders of the defined types were placed in this encounter.    Dr Durwin Reges Eldredge Veldhuizen ABFM University Physician

## 2023-07-31 ENCOUNTER — Encounter: Payer: Self-pay | Admitting: Family Medicine

## 2023-07-31 LAB — PAP IG (IMAGE GUIDED)

## 2023-07-31 NOTE — Progress Notes (Signed)
Good afternoon Sella  Your Pap smear came back as ASCUS - nothing to worry about but it does mean you need a repeat Pap Smear in 1 year. It is probably related to your ongoing issues with yeast infections Remember that I have retired, so any questions will need to be sent to one of the other providers  Dr Durwin Reges Gearl Kimbrough ABFM University Physician

## 2023-08-04 ENCOUNTER — Encounter: Payer: Self-pay | Admitting: Physician Assistant

## 2023-08-04 ENCOUNTER — Ambulatory Visit (INDEPENDENT_AMBULATORY_CARE_PROVIDER_SITE_OTHER): Payer: BC Managed Care – PPO | Admitting: Physician Assistant

## 2023-08-04 VITALS — BP 92/66 | HR 103 | Temp 95.9°F | Ht 67.0 in | Wt 131.0 lb

## 2023-08-04 DIAGNOSIS — R202 Paresthesia of skin: Secondary | ICD-10-CM

## 2023-08-04 DIAGNOSIS — E1065 Type 1 diabetes mellitus with hyperglycemia: Secondary | ICD-10-CM

## 2023-08-04 NOTE — Progress Notes (Unsigned)
[facility] [address] [place], [place] [address] Phone: [phone] Fax: [phone]   Office Visit Note  Patient Name: Kristina Sparks  Date of [id number]  Med Rec number [id number]   Patient has no known allergies.  Chief Complaint  Patient presents with  . Fatigue    Has type 1 diabetes and feels worse symptomatically when she takes her insulin versus when she does not. Have noticed more significant over the last few days   . Tingling    Pins and needles in feet but was told this was to be expected per endocrinologist    Type 1 DM and feeling worse   Not having morning sweats. Feels anxious.   Pins and needles in feet   Reached out to Kristina Sparks and Kristina Sparks  - has not heard back about her supplies. The last time she reached out to them was in July  Was supposed to get the omnipod and has not heard anything back - was using the [employer]    November 4 is next appointment    Foot exam good  Some nause aand no emesis    Current Medication:  Outpatient Encounter Medications as of 08/04/2023  Medication Sig  . cetirizine (ZYRTEC ALLERGY) 10 MG tablet Take 1 tablet (10 mg total) by mouth daily. (Patient taking differently: Take 10 mg by mouth daily as needed for allergies.)  . Cholecalciferol (D3 ADULT PO) Take by mouth.  . fluconazole (DIFLUCAN) 150 MG tablet Take 1 tablet (150 mg total) by mouth once a week.  . insulin aspart protamine - aspart (NOVOLOG 70/30 MIX) (70-30) 100 UNIT/ML FlexPen Inject 16 Units into the skin 2 (two) times daily. (Patient taking differently: Inject 16 Units into the skin 2 (two) times daily. Taking 20 Units QAM and 10 Units in PM)  . norgestimate-ethinyl estradiol (MILI) 0.25-35 MG-MCG tablet Take 1 tablet by mouth daily.  . blood glucose meter kit and supplies KIT Dispense based on patient and insurance preference. Use up to four times daily as directed. (Patient not taking: Reported on  07/05/2023)  . Continuous Blood Gluc Sensor (DEXCOM G7 SENSOR) MISC 1 Device by Does not apply route as directed. (Patient not taking: Reported on 07/05/2023)   No facility-administered encounter medications on file as of 08/04/2023.      Medical History: Past Medical History:  Diagnosis Date  . Atypical squamous cells of undetermined significance (ASCUS) on Papanicolaou smear of cervix    needs repeat Pap smear in 06/2024  . Diabetes mellitus type 1, uncomplicated (HCC)   . Vitamin D deficiency      Vital Signs: BP 92/66   Pulse (!) 103   Temp (!) 95.9 F (35.5 C)   Ht 5\' 7"  (1.702 m)   Wt 131 lb (59.4 kg)   SpO2 96%   BMI 20.52 kg/m    ROS negative unless otherwise indicated above.  Physical Exam  No results found for this or any previous visit (from the past 24 hour(s)).   Assessment/Plan:  There are no diagnoses linked to this encounter.   General Counseling: Kristina Sparks verbalizes understanding of the findings of todays visit and agrees with plan of treatment. I have discussed any further diagnostic evaluation that may be needed or ordered today. We also reviewed her medications today. she has been encouraged to call the office with any questions or concerns that should arise related to todays visit.   No orders of the defined types were placed  in this encounter.   No orders of the defined types were placed in this encounter.     Signed, Lennon Alstrom, PA-C 08/04/2023, 11:10 AM

## 2023-08-08 LAB — CBC WITH DIFFERENTIAL/PLATELET
Basophils Absolute: 0 10*3/uL (ref 0.0–0.2)
Basos: 1 %
EOS (ABSOLUTE): 0.1 10*3/uL (ref 0.0–0.4)
Eos: 2 %
Hematocrit: 46.1 % (ref 34.0–46.6)
Hemoglobin: 14.7 g/dL (ref 11.1–15.9)
Immature Grans (Abs): 0 10*3/uL (ref 0.0–0.1)
Immature Granulocytes: 0 %
Lymphocytes Absolute: 2.4 10*3/uL (ref 0.7–3.1)
Lymphs: 61 %
MCH: 25.8 pg — ABNORMAL LOW (ref 26.6–33.0)
MCHC: 31.9 g/dL (ref 31.5–35.7)
MCV: 81 fL (ref 79–97)
Monocytes Absolute: 0.2 10*3/uL (ref 0.1–0.9)
Monocytes: 5 %
Neutrophils Absolute: 1.2 10*3/uL — ABNORMAL LOW (ref 1.4–7.0)
Neutrophils: 31 %
Platelets: 270 10*3/uL (ref 150–450)
RBC: 5.69 x10E6/uL — ABNORMAL HIGH (ref 3.77–5.28)
RDW: 12.6 % (ref 11.7–15.4)
WBC: 3.9 10*3/uL (ref 3.4–10.8)

## 2023-08-08 LAB — BASIC METABOLIC PANEL
BUN/Creatinine Ratio: 16 (ref 9–23)
BUN: 10 mg/dL (ref 6–20)
CO2: 21 mmol/L (ref 20–29)
Calcium: 9.2 mg/dL (ref 8.7–10.2)
Chloride: 98 mmol/L (ref 96–106)
Creatinine, Ser: 0.62 mg/dL (ref 0.57–1.00)
Glucose: 354 mg/dL — ABNORMAL HIGH (ref 70–99)
Potassium: 4.5 mmol/L (ref 3.5–5.2)
Sodium: 135 mmol/L (ref 134–144)
eGFR: 130 mL/min/{1.73_m2} (ref >59)

## 2023-08-08 LAB — MAGNESIUM: Magnesium: 1.7 mg/dL (ref 1.6–2.3)

## 2023-08-10 NOTE — Progress Notes (Signed)
Blood sugar came back very high. Are you taking your insulin?

## 2023-09-25 DIAGNOSIS — E1065 Type 1 diabetes mellitus with hyperglycemia: Secondary | ICD-10-CM | POA: Diagnosis not present

## 2023-10-02 ENCOUNTER — Encounter: Payer: Self-pay | Admitting: Adult Health

## 2023-10-02 ENCOUNTER — Ambulatory Visit (INDEPENDENT_AMBULATORY_CARE_PROVIDER_SITE_OTHER): Payer: BC Managed Care – PPO | Admitting: Adult Health

## 2023-10-02 ENCOUNTER — Other Ambulatory Visit: Payer: Self-pay

## 2023-10-02 ENCOUNTER — Ambulatory Visit: Payer: BC Managed Care – PPO | Admitting: Physician Assistant

## 2023-10-02 VITALS — BP 100/81 | HR 109 | Temp 98.4°F

## 2023-10-02 DIAGNOSIS — L0591 Pilonidal cyst without abscess: Secondary | ICD-10-CM | POA: Diagnosis not present

## 2023-10-02 MED ORDER — CEPHALEXIN 500 MG PO CAPS: 500.0000 mg | ORAL_CAPSULE | Freq: Two times a day (BID) | ORAL | 0 refills | Status: DC

## 2023-10-02 NOTE — Progress Notes (Signed)
[facility] [address] [place], [place] [address] Phone: [phone] Fax: [phone]   Office Visit Note  Patient Name: Kristina Sparks  Date of [id number]  Med Rec number [id number]  Date of Service: 10/02/2023  Patient has no known allergies.  Chief Complaint  Patient presents with   Acute Visit     HPI  Patient is here reporting she thinks she has a Pilonidal cyst.  She noticed that she was having pain for the last 3 days.  She has not tried any OTC meds.  She She has a hx of DM1  70/30 16 units twice daily. She has not been checking her sugars.  She is waiting for her insulin pump system to show up. Most recent blood sugar was in the 400's last week.   Current Medication:  Outpatient Encounter Medications as of 10/02/2023  Medication Sig   cephALEXin (KEFLEX) 500 MG capsule Take 1 capsule (500 mg total) by mouth 2 (two) times daily.   blood glucose meter kit and supplies KIT Dispense based on patient and insurance preference. Use up to four times daily as directed. (Patient not taking: Reported on 07/05/2023)   cetirizine (ZYRTEC ALLERGY) 10 MG tablet Take 1 tablet (10 mg total) by mouth daily. (Patient taking differently: Take 10 mg by mouth daily as needed for allergies.)   fluconazole (DIFLUCAN) 150 MG tablet Take 1 tablet (150 mg total) by mouth once a week.   insulin aspart protamine - aspart (NOVOLOG 70/30 MIX) (70-30) 100 UNIT/ML FlexPen Inject 16 Units into the skin 2 (two) times daily. (Patient taking differently: Inject 16 Units into the skin 2 (two) times daily. Taking 20 Units QAM and 10 Units in PM)   norgestimate-ethinyl estradiol (MILI) 0.25-35 MG-MCG tablet Take 1 tablet by mouth daily.   [DISCONTINUED] Cholecalciferol (D3 ADULT PO) Take by mouth.   [DISCONTINUED] Continuous Blood Gluc Sensor (DEXCOM G7 SENSOR) MISC 1 Device by Does not apply route as directed. (Patient not taking: Reported on 07/05/2023)   No facility-administered  encounter medications on file as of 10/02/2023.      Medical History: Past Medical History:  Diagnosis Date   Atypical squamous cells of undetermined significance (ASCUS) on Papanicolaou smear of cervix    needs repeat Pap smear in 06/2024   Diabetes mellitus type 1, uncomplicated (HCC)    Vitamin D deficiency      Vital Signs: BP 100/81   Pulse (!) 109   Temp 98.4 F (36.9 C) (Tympanic)   SpO2 100%    Review of Systems  Musculoskeletal:        Pain at top of buttocks.     Physical Exam Skin:         Comments: Pilonidal cyst at top of natal cleft, right side    Assessment/Plan: 1. Pilonidal cyst Given that patients cyst feels solid, and that her Blood glucose is elevated, we decided to avoid I&D at this time.  Start oral antibiotics, and follow up in 2 days in clinic, sooner if symptoms worsen or fever develops.     General Counseling: Lilith verbalizes understanding of the findings of todays visit and agrees with plan of treatment. I have discussed any further diagnostic evaluation that may be needed or ordered today. We also reviewed her medications today. she has been encouraged to call the office with any questions or concerns that should arise related to todays visit.   No orders of the defined types were placed in this encounter.  Meds ordered this encounter  Medications   cephALEXin (KEFLEX) 500 MG capsule    Sig: Take 1 capsule (500 mg total) by mouth 2 (two) times daily.    Dispense:  20 capsule    Refill:  0    Time spent:15 Minutes Time spent includes review of chart, medications, test results, and follow up plan with the patient.    Johnna Acosta AGNP-C Nurse Practitioner

## 2023-10-04 ENCOUNTER — Encounter: Payer: Self-pay | Admitting: Adult Health

## 2023-10-06 ENCOUNTER — Ambulatory Visit: Payer: BC Managed Care – PPO | Admitting: Physician Assistant

## 2023-10-06 ENCOUNTER — Ambulatory Visit (INDEPENDENT_AMBULATORY_CARE_PROVIDER_SITE_OTHER): Payer: BC Managed Care – PPO | Admitting: Medical

## 2023-10-06 ENCOUNTER — Encounter: Payer: Self-pay | Admitting: Medical

## 2023-10-06 ENCOUNTER — Other Ambulatory Visit: Payer: Self-pay

## 2023-10-06 VITALS — HR 99 | Temp 97.4°F

## 2023-10-06 DIAGNOSIS — L0591 Pilonidal cyst without abscess: Secondary | ICD-10-CM | POA: Diagnosis not present

## 2023-10-06 NOTE — Progress Notes (Signed)
[facility] [address] [place], [place] [address] Phone: [phone] Fax: [phone]   Office Visit Note  Patient Name: Kristina Sparks  Date of [id number]  Med Rec number [id number]  Date of Service: 10/06/2023  Allergies: Patient has no known allergies.  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  Has been taking Cephalexin last 3 days. Also doing warm compresses and epsom salt baths. Two days ago, cyst seemed to drain (yellow and bloody). Has had less pain since then. Has been more comfortable to sit. No further drainage yesterday or today. No fever or chills. Also taking Advil and Tylenol alternating.  Has never had pilonidal cyst/abscess in past.   Pain 6/10 currently.  Has been taking 70/30 insulin as instructed by endocrinology.   Current Medication:  Outpatient Encounter Medications as of 10/06/2023  Medication Sig   blood glucose meter kit and supplies KIT Dispense based on patient and insurance preference. Use up to four times daily as directed. (Patient not taking: Reported on 07/05/2023)   cephALEXin (KEFLEX) 500 MG capsule Take 1 capsule (500 mg total) by mouth 2 (two) times daily.   cetirizine (ZYRTEC ALLERGY) 10 MG tablet Take 1 tablet (10 mg total) by mouth daily. (Patient taking differently: Take 10 mg by mouth daily as needed for allergies.)   fluconazole (DIFLUCAN) 150 MG tablet Take 1 tablet (150 mg total) by mouth once a week.   insulin aspart protamine - aspart (NOVOLOG 70/30 MIX) (70-30) 100 UNIT/ML FlexPen Inject 16 Units into the skin 2 (two) times daily. (Patient taking differently: Inject 16 Units into the skin 2 (two) times daily. Taking 20 Units QAM and 10 Units in PM)   norgestimate-ethinyl estradiol (MILI) 0.25-35 MG-MCG tablet Take 1 tablet by mouth daily.   No facility-administered encounter medications on file as of 10/06/2023.      Medical  History: Past Medical History:  Diagnosis Date   Atypical squamous cells of undetermined significance (ASCUS) on Papanicolaou smear of cervix    needs repeat Pap smear in 06/2024   Diabetes mellitus type 1, uncomplicated (HCC)    Vitamin D deficiency      Vital Signs: Pulse 99   Temp (!) 97.4 F (36.3 C) (Tympanic)   SpO2 99%    Review of Systems  Constitutional:  Negative for chills and fever.  Musculoskeletal:  Positive for back pain (over sacral spine).    Physical Exam Vitals reviewed.  Constitutional:      General: She is not in acute distress.    Appearance: She is not ill-appearing.  Skin:    Comments: Approximately 2-3 in area of mild erythema, mild induration and tenderness at superior aspect of natal cleft. Small healing wound to right of midline. No active drainage. No fluctuant areas.  Neurological:     Mental Status: She is alert.     Assessment/Plan: 1. Pilonidal cyst Spontaneous drainage of cyst/abscess a few days ago appears to have provided significant relief to patient. Infection appears to be improving on exam. Advised to finish all antibiotics. Continue warm compresses and epsom salt baths until pain resolves. Reminded of importance of controlling blood sugar for infection to resolve. Will have patient return in 1 week to recheck.  Patient Instructions  -Doreatha Martin all antibiotics. -Continue warm compresses and epsom salt soaks until pain resolves. -You may take over-the-counter Ibuprofen/Advil or Naproxen/Aleve as needed for pain. -Continue taking  your insulin consistently to keep your blood sugars under control. -Return for follow up visit in 1 week or schedule earlier visit if needed for new/worsening symptoms.      General Counseling: [name] has been encouraged to call the office with any questions or concerns that should arise related to todays visit.    Time spent:20 Minutes    Jonathon Resides PA-C [facility] 10/06/2023 2:58 PM

## 2023-10-12 ENCOUNTER — Encounter: Payer: Self-pay | Admitting: Medical

## 2023-10-12 ENCOUNTER — Other Ambulatory Visit: Payer: Self-pay

## 2023-10-12 ENCOUNTER — Ambulatory Visit (INDEPENDENT_AMBULATORY_CARE_PROVIDER_SITE_OTHER): Payer: BC Managed Care – PPO | Admitting: Medical

## 2023-10-12 VITALS — HR 90 | Temp 97.6°F

## 2023-10-12 DIAGNOSIS — E1065 Type 1 diabetes mellitus with hyperglycemia: Secondary | ICD-10-CM | POA: Diagnosis not present

## 2023-10-12 DIAGNOSIS — L0591 Pilonidal cyst without abscess: Secondary | ICD-10-CM | POA: Diagnosis not present

## 2023-10-12 MED ORDER — INSULIN ASPART PROT & ASPART (70-30 MIX) 100 UNIT/ML PEN: 16.0000 [IU] | PEN_INJECTOR | Freq: Two times a day (BID) | SUBCUTANEOUS | 0 refills | Status: DC

## 2023-10-12 MED ORDER — CEPHALEXIN 500 MG PO CAPS: 500.0000 mg | ORAL_CAPSULE | Freq: Two times a day (BID) | ORAL | 0 refills | Status: AC

## 2023-10-12 NOTE — Progress Notes (Signed)
[facility] [address] [place], [place] [address] Phone: [phone] Fax: [phone]   Office Visit Note  Patient Name: Kristina Sparks  Date of [id number]  Med Rec number [id number]  Date of Service: 10/12/2023  Allergies: Patient has no known allergies.  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   See previous notes. Has had less pain and swelling around natal cleft. Almost done with antibiotic. Has continued heating pad. Only has pain if something hits area or sits down too forcefully. Leaving in 2 days to travel up to [place] with boyfriend, then at home next week.  No fever or chills. No further drainage.   Patient states she is almost out of current supply of Novolog 70/30. Was only given a single pen from pharmacy for most recent fill. Paperwork from pharmacy says "no refills". Next appt with endocrinology 12/9. Pt states she has sent MyChart message to endocrinologist, no response so far. Patient currently using 16 units BID.  Current Medication:  Outpatient Encounter Medications as of 10/12/2023  Medication Sig   blood glucose meter kit and supplies KIT Dispense based on patient and insurance preference. Use up to four times daily as directed. (Patient not taking: Reported on 07/05/2023)   cephALEXin (KEFLEX) 500 MG capsule Take 1 capsule (500 mg total) by mouth 2 (two) times daily.   cetirizine (ZYRTEC ALLERGY) 10 MG tablet Take 1 tablet (10 mg total) by mouth daily. (Patient taking differently: Take 10 mg by mouth daily as needed for allergies.)   fluconazole (DIFLUCAN) 150 MG tablet Take 1 tablet (150 mg total) by mouth once a week.   insulin aspart protamine - aspart (NOVOLOG 70/30 MIX) (70-30) 100 UNIT/ML FlexPen Inject 16 Units into the skin 2 (two) times daily. (Patient taking differently: Inject 16 Units into the skin 2 (two) times daily. Taking 20  Units QAM and 10 Units in PM)   norgestimate-ethinyl estradiol (MILI) 0.25-35 MG-MCG tablet Take 1 tablet by mouth daily.   No facility-administered encounter medications on file as of 10/12/2023.      Medical History: Past Medical History:  Diagnosis Date   Atypical squamous cells of undetermined significance (ASCUS) on Papanicolaou smear of cervix    needs repeat Pap smear in 06/2024   Diabetes mellitus type 1, uncomplicated (HCC)    Vitamin D deficiency      Vital Signs: Pulse 90   Temp 97.6 F (36.4 C) (Tympanic)   SpO2 99%    Review of Systems  Constitutional:  Negative for chills and fever.  Musculoskeletal:  Positive for back pain (low back near buttocks).    Physical Exam Vitals reviewed.  Constitutional:      General: She is not in acute distress.    Appearance: She is not ill-appearing.  Skin:    Comments: Slight erythema to medial right buttock. No drainage. Mild induration to R/L medial buttocks (less than before). Skin with normal temperature.  Neurological:     Mental Status: She is alert.     Assessment/Plan: 1. Pilonidal cyst Symptoms and exam findings improving. Will extend antibiotic 7 more days. Patient advised to follow up as needed after break.  - cephALEXin (KEFLEX) 500 MG capsule; Take 1 capsule (500 mg total) by mouth 2 (two) times daily for 7 days.  Dispense: 14 capsule; Refill: 0  2. Type 1 diabetes mellitus with hyperglycemia [facility]) Prescription sent to  pharmacy for additional insulin (5 pens). If not given 5 pens from pharmacy, patient advised to ask pharmacist when she can pick up remainder of her prescription.   - insulin aspart protamine - aspart (NOVOLOG 70/30 MIX) (70-30) 100 UNIT/ML FlexPen; Inject 16 Units into the skin 2 (two) times daily.  Dispense: 15 mL; Refill: 0  Patient Instructions  -Take complete course of antibiotics as prescribed.  Take with food. I have sent an additional 7-day supply of antibiotic to the pharmacy. -I  have sent a prescription for 5 additional insulin pens to the pharmacy. If the pharmacy does not give you all 5 pens, make sure to ask them when you can pick up the remainder of your prescription.      General Counseling: Terrena  has been encouraged to call the office with any questions or concerns that should arise related to todays visit.    Time spent:20 Minutes    Jonathon Resides PA-C [facility] 10/12/2023 1:05 PM

## 2023-10-21 NOTE — Patient Instructions (Signed)
-  Finish all antibiotics. -Continue warm compresses and epsom salt soaks until pain resolves. -You may take over-the-counter Ibuprofen/Advil or Naproxen/Aleve as needed for pain. -Continue taking your insulin consistently to keep your blood sugars under control. -Return for follow up visit in 1 week or schedule earlier visit if needed for new/worsening symptoms.

## 2023-10-23 NOTE — Patient Instructions (Signed)
-  Take complete course of antibiotics as prescribed.  Take with food. I have sent an additional 7-day supply of antibiotic to the pharmacy. -I have sent a prescription for 5 additional insulin pens to the pharmacy. If the pharmacy does not give you all 5 pens, make sure to ask them when you can pick up the remainder of your prescription.

## 2023-10-30 DIAGNOSIS — E1065 Type 1 diabetes mellitus with hyperglycemia: Secondary | ICD-10-CM | POA: Diagnosis not present

## 2024-03-15 ENCOUNTER — Ambulatory Visit: Admitting: Medical

## 2024-07-17 ENCOUNTER — Ambulatory Visit: Admitting: Obstetrics & Gynecology

## 2024-07-17 ENCOUNTER — Encounter: Payer: Self-pay | Admitting: Obstetrics & Gynecology

## 2024-07-17 ENCOUNTER — Other Ambulatory Visit (HOSPITAL_COMMUNITY)
Admission: RE | Admit: 2024-07-17 | Discharge: 2024-07-17 | Disposition: A | Discharge status: Home / Self Care | Source: Ambulatory Visit | Attending: Obstetrics & Gynecology | Admitting: Obstetrics & Gynecology

## 2024-07-17 VITALS — BP 108/78 | HR 111 | Ht 67.0 in | Wt 149.0 lb

## 2024-07-17 DIAGNOSIS — Z01419 Encounter for gynecological examination (general) (routine) without abnormal findings: Secondary | ICD-10-CM | POA: Diagnosis present

## 2024-07-17 DIAGNOSIS — Z113 Encounter for screening for infections with a predominantly sexual mode of transmission: Secondary | ICD-10-CM | POA: Diagnosis present

## 2024-07-17 DIAGNOSIS — N760 Acute vaginitis: Secondary | ICD-10-CM

## 2024-07-17 DIAGNOSIS — Z1331 Encounter for screening for depression: Secondary | ICD-10-CM | POA: Diagnosis not present

## 2024-07-17 DIAGNOSIS — Z309 Encounter for contraceptive management, unspecified: Secondary | ICD-10-CM

## 2024-07-17 DIAGNOSIS — Z01411 Encounter for gynecological examination (general) (routine) with abnormal findings: Secondary | ICD-10-CM

## 2024-07-17 DIAGNOSIS — F419 Anxiety disorder, unspecified: Secondary | ICD-10-CM

## 2024-07-17 DIAGNOSIS — R87610 Atypical squamous cells of undetermined significance on cytologic smear of cervix (ASC-US): Secondary | ICD-10-CM | POA: Insufficient documentation

## 2024-07-17 MED ORDER — NYSTATIN 100000 UNIT/GM EX CREA: 1.0000 | TOPICAL_CREAM | Freq: Two times a day (BID) | CUTANEOUS | 1 refills | Status: DC

## 2024-07-17 MED ORDER — FLUCONAZOLE 150 MG PO TABS: ORAL_TABLET | ORAL | 1 refills | Status: DC

## 2024-07-17 NOTE — Progress Notes (Addendum)
 GYNECOLOGY ANNUAL PREVENTATIVE CARE ENCOUNTER NOTE  History:    Kristina Sparks is a 23 y.o. G0P0000 female here for a routine annual gynecologic exam.  Current complaints: recurrent yeast vaginitis since her diagnosis of Type 1 DM, wants management.  Also wondering if she has PCOS, has regular menses, no abnormal hair growth or acne.   Denies abnormal vaginal bleeding, pelvic pain, problems with intercourse or other gynecologic concerns.  Gynecologic History No LMP recorded. Contraception: condoms Last Pap: 07/20/2023. Result was abnormal with ASCUS, no HPV testing done   Obstetric History OB History  Gravida Para Term Preterm AB Living  0 0 0 0 0 0  SAB IAB Ectopic Multiple Live Births  0 0 0 0 0    Past Medical History:  Diagnosis Date   Atypical squamous cells of undetermined significance (ASCUS) on Papanicolaou smear of cervix    needs repeat Pap smear in 06/2024   Diabetes mellitus type 1, uncomplicated (HCC)    Vitamin D  deficiency     No past surgical history on file.  Current Outpatient Medications on File Prior to Visit  Medication Sig Dispense Refill   insulin  aspart protamine - aspart (NOVOLOG  70/30 MIX) (70-30) 100 UNIT/ML FlexPen Inject 16 Units into the skin 2 (two) times daily. 15 mL 0   blood glucose meter kit and supplies KIT Dispense based on patient and insurance preference. Use up to four times daily as directed. (Patient not taking: Reported on 07/17/2024) 1 each 0   No current facility-administered medications on file prior to visit.    No Known Allergies  Social History:  reports that she has never smoked. She has never used smokeless tobacco. She reports current alcohol use. She reports current drug use. Drug: Marijuana.  Family History  Problem Relation Age of Onset   Cancer Paternal Grandmother    Breast cancer Maternal Grandmother    Throat cancer Maternal Grandmother    Crohn's disease Maternal Uncle     The following portions of the  patient's history were reviewed and updated as appropriate: allergies, current medications, past family history, past medical history, past social history, past surgical history and problem list.  Review of Systems Pertinent items noted in HPI and remainder of comprehensive ROS otherwise negative.  Physical Exam:  BP 108/78   Pulse (!) 111   Ht 5' 7 (1.702 m)   Wt 149 lb (67.6 kg)   BMI 23.34 kg/m  CONSTITUTIONAL: Well-developed, well-nourished female in no acute distress.  HENT:  Normocephalic, atraumatic, External right and left ear normal.  EYES: Conjunctivae and EOM are normal. Pupils are equal, round, and reactive to light. No scleral icterus.  NECK: Normal range of motion, supple, no masses observed. SKIN: Skin is warm and dry. No rash noted. Not diaphoretic. No erythema. No pallor. MUSCULOSKELETAL: Normal range of motion. No tenderness.  No cyanosis, clubbing, or edema. NEUROLOGIC: Alert and oriented to person, place, and time. Normal muscle tone coordination.  PSYCHIATRIC: Normal mood and affect. Normal behavior. Normal judgment and thought content. CARDIOVASCULAR: Normal heart rate noted, regular rhythm RESPIRATORY: Clear to auscultation bilaterally. Effort and breath sounds normal, no problems with respiration noted. BREASTS: Symmetric in size. No masses, tenderness, skin changes, nipple drainage, or lymphadenopathy bilaterally. Performed in the presence of a chaperone. ABDOMEN: Soft, no distention noted.  No tenderness, rebound or guarding.  PELVIC: Diffuse significant erythema and excoriation noted on bilateral labia, extending to the skin in the perianal area.  Normal appearing urethral meatus;  normal appearing vaginal mucosa and cervix.  Thick, curdy white vaginal discharge noted, testing sample obtained.  Pap smear obtained.  Normal uterine size, no other palpable masses, no uterine or adnexal tenderness.  Performed in the presence of a chaperone.     07/17/2024    1:53 PM  10/25/2021    2:01 PM  GAD 7 : Generalized Anxiety Score  Nervous, Anxious, on Edge 2 2  Control/stop worrying 2 2  Worry too much - different things 2 2  Trouble relaxing 2 2  Restless 1 1  Easily annoyed or irritable 3 2  Afraid - awful might happen 2 1  Total GAD 7 Score 14 12       07/17/2024    1:53 PM 07/28/2022    3:02 PM 10/25/2021    2:00 PM  Depression screen PHQ 2/9  Decreased Interest 2 1 1   Down, Depressed, Hopeless 1 1 1   PHQ - 2 Score 3 2 2   Altered sleeping 2  1  Tired, decreased energy 2  1  Change in appetite 1  2  Feeling bad or failure about yourself  2  1  Trouble concentrating 1  1  Moving slowly or fidgety/restless 1  1  Suicidal thoughts 0  0  PHQ-9 Score 12  9    Assessment and Plan:     1. Anxiety and depression Patient has no mental health provider and is on no medications.  History of admission to [facility] in 2023. Referral sent to [facility] so she can re-establish care with a provider there.  SI/HI precautions reviewed. - Ambulatory referral to Psychiatry  2. Recurrent vaginitis Prescribed prolonged Diflucan  therapy and Nystatin  cream. Will follow up testing and manage accordingly. - fluconazole  (DIFLUCAN ) 150 MG tablet; Take one tablet by mouth every three days for three doses, then take one tablet once a week for six months  Dispense: 30 tablet; Refill: 1 - Cervicovaginal ancillary only - nystatin  cream (MYCOSTATIN ); Apply 1 Application topically 2 (two) times daily.  Dispense: 30 g; Refill: 1  3. Encounter for contraceptive management, unspecified type Patient is on condoms for contraception and STI prevention. She may be considering restarting OCPs, she will let us  know.  4. Routine screening for STI (sexually transmitted infection) Only wants vaginal STI screening, declines serum testing.  Will follow up results and manage accordingly. - Cervicovaginal ancillary only  5. Pap smear abnormality of cervix with ASCUS in 2024 6. Well woman exam with  routine gynecological exam (Primary) - Cytology - PAP done today, will follow up results and manage accordingly. Normal breast examination today, she was advised to perform periodic self breast examinations.  Reassured that she does not have the signs/symptoms of PCOS for now, she was reassured.  Routine preventative health maintenance measures emphasized. Please refer to After Visit Summary for other counseling recommendations.      GLORIS HUGGER, MD, FACOG Obstetrician & Gynecologist, [facility] for [employer], [facility]

## 2024-07-19 LAB — CERVICOVAGINAL ANCILLARY ONLY
Bacterial Vaginitis (gardnerella): NEGATIVE
Candida Glabrata: POSITIVE — AB
Candida Vaginitis: POSITIVE — AB
Chlamydia: NEGATIVE
Comment: NEGATIVE
Comment: NEGATIVE
Comment: NEGATIVE
Comment: NEGATIVE
Comment: NEGATIVE
Comment: NORMAL
Neisseria Gonorrhea: NEGATIVE
Trichomonas: NEGATIVE

## 2024-07-20 ENCOUNTER — Ambulatory Visit: Payer: Self-pay | Admitting: Obstetrics & Gynecology

## 2024-07-20 DIAGNOSIS — B379 Candidiasis, unspecified: Secondary | ICD-10-CM

## 2024-07-20 DIAGNOSIS — N760 Acute vaginitis: Secondary | ICD-10-CM

## 2024-07-20 MED ORDER — BORIC ACID CRYS: 600.0000 mg | CRYSTALS | Freq: Every day | 5 refills | Status: DC

## 2024-07-24 LAB — CYTOLOGY - PAP
Comment: NEGATIVE
Diagnosis: NEGATIVE
High risk HPV: NEGATIVE

## 2024-08-02 DIAGNOSIS — Z419 Encounter for procedure for purposes other than remedying health state, unspecified: Secondary | ICD-10-CM | POA: Diagnosis not present

## 2024-09-07 DIAGNOSIS — Z111 Encounter for screening for respiratory tuberculosis: Secondary | ICD-10-CM | POA: Diagnosis not present

## 2024-11-18 ENCOUNTER — Other Ambulatory Visit: Payer: Self-pay

## 2024-11-18 ENCOUNTER — Inpatient Hospital Stay (HOSPITAL_COMMUNITY)
Admission: EM | Admit: 2024-11-18 | Discharge: 2024-11-21 | DRG: 639 | Disposition: A | Discharge status: Home / Self Care | Attending: Internal Medicine | Admitting: Internal Medicine

## 2024-11-18 ENCOUNTER — Encounter (HOSPITAL_COMMUNITY): Payer: Self-pay

## 2024-11-18 ENCOUNTER — Emergency Department (HOSPITAL_COMMUNITY)

## 2024-11-18 DIAGNOSIS — Z1152 Encounter for screening for COVID-19: Secondary | ICD-10-CM

## 2024-11-18 DIAGNOSIS — R000 Tachycardia, unspecified: Secondary | ICD-10-CM | POA: Diagnosis not present

## 2024-11-18 DIAGNOSIS — Z794 Long term (current) use of insulin: Secondary | ICD-10-CM | POA: Diagnosis not present

## 2024-11-18 DIAGNOSIS — E1010 Type 1 diabetes mellitus with ketoacidosis without coma: Secondary | ICD-10-CM | POA: Diagnosis present

## 2024-11-18 DIAGNOSIS — Z91128 Patient's intentional underdosing of medication regimen for other reason: Secondary | ICD-10-CM | POA: Diagnosis not present

## 2024-11-18 DIAGNOSIS — R0781 Pleurodynia: Secondary | ICD-10-CM | POA: Diagnosis present

## 2024-11-18 DIAGNOSIS — E0810 Diabetes mellitus due to underlying condition with ketoacidosis without coma: Secondary | ICD-10-CM

## 2024-11-18 DIAGNOSIS — E1110 Type 2 diabetes mellitus with ketoacidosis without coma: Secondary | ICD-10-CM | POA: Diagnosis present

## 2024-11-18 DIAGNOSIS — Z803 Family history of malignant neoplasm of breast: Secondary | ICD-10-CM | POA: Diagnosis not present

## 2024-11-18 DIAGNOSIS — R739 Hyperglycemia, unspecified: Principal | ICD-10-CM

## 2024-11-18 DIAGNOSIS — E1065 Type 1 diabetes mellitus with hyperglycemia: Secondary | ICD-10-CM | POA: Diagnosis not present

## 2024-11-18 DIAGNOSIS — R0602 Shortness of breath: Secondary | ICD-10-CM | POA: Diagnosis not present

## 2024-11-18 DIAGNOSIS — T383X6A Underdosing of insulin and oral hypoglycemic [antidiabetic] drugs, initial encounter: Secondary | ICD-10-CM | POA: Diagnosis present

## 2024-11-18 DIAGNOSIS — E876 Hypokalemia: Secondary | ICD-10-CM | POA: Diagnosis not present

## 2024-11-18 DIAGNOSIS — Z808 Family history of malignant neoplasm of other organs or systems: Secondary | ICD-10-CM

## 2024-11-18 DIAGNOSIS — J101 Influenza due to other identified influenza virus with other respiratory manifestations: Secondary | ICD-10-CM | POA: Diagnosis present

## 2024-11-18 DIAGNOSIS — Z5971 Insufficient health insurance coverage: Secondary | ICD-10-CM | POA: Diagnosis not present

## 2024-11-18 LAB — CBC WITH DIFFERENTIAL/PLATELET
Abs Immature Granulocytes: 0.18 K/uL — ABNORMAL HIGH (ref 0.00–0.07)
Basophils Absolute: 0 K/uL (ref 0.0–0.1)
Basophils Relative: 0 %
Eosinophils Absolute: 0 K/uL (ref 0.0–0.5)
Eosinophils Relative: 0 %
HCT: 50.6 % — ABNORMAL HIGH (ref 36.0–46.0)
Hemoglobin: 15.5 g/dL — ABNORMAL HIGH (ref 12.0–15.0)
Immature Granulocytes: 1 %
Lymphocytes Relative: 14 %
Lymphs Abs: 2.2 K/uL (ref 0.7–4.0)
MCH: 26.8 pg (ref 26.0–34.0)
MCHC: 30.6 g/dL (ref 30.0–36.0)
MCV: 87.5 fL (ref 80.0–100.0)
Monocytes Absolute: 0.7 K/uL (ref 0.1–1.0)
Monocytes Relative: 5 %
Neutro Abs: 12.9 K/uL — ABNORMAL HIGH (ref 1.7–7.7)
Neutrophils Relative %: 80 %
Platelets: 287 K/uL (ref 150–400)
RBC: 5.78 MIL/uL — ABNORMAL HIGH (ref 3.87–5.11)
RDW: 13.4 % (ref 11.5–15.5)
WBC: 16 K/uL — ABNORMAL HIGH (ref 4.0–10.5)
nRBC: 0 % (ref 0.0–0.2)

## 2024-11-18 LAB — CBG MONITORING, ED
Glucose-Capillary: 182 mg/dL — ABNORMAL HIGH (ref 70–99)
Glucose-Capillary: 192 mg/dL — ABNORMAL HIGH (ref 70–99)
Glucose-Capillary: 195 mg/dL — ABNORMAL HIGH (ref 70–99)
Glucose-Capillary: 197 mg/dL — ABNORMAL HIGH (ref 70–99)
Glucose-Capillary: 200 mg/dL — ABNORMAL HIGH (ref 70–99)
Glucose-Capillary: 338 mg/dL — ABNORMAL HIGH (ref 70–99)
Glucose-Capillary: 433 mg/dL — ABNORMAL HIGH (ref 70–99)
Glucose-Capillary: 523 mg/dL (ref 70–99)

## 2024-11-18 LAB — URINALYSIS, ROUTINE W REFLEX MICROSCOPIC
Bacteria, UA: NONE SEEN
Bilirubin Urine: NEGATIVE
Glucose, UA: >=500 mg/dL — AB
Ketones, ur: 80 mg/dL — AB
Leukocytes,Ua: NEGATIVE
Nitrite: NEGATIVE
Protein, ur: 100 mg/dL — AB
Specific Gravity, Urine: 1.024 (ref 1.005–1.030)
pH: 5 (ref 5.0–8.0)

## 2024-11-18 LAB — RESP PANEL BY RT-PCR (RSV, FLU A&B, COVID)  RVPGX2
Influenza A by PCR: POSITIVE — AB
Influenza B by PCR: NEGATIVE
Resp Syncytial Virus by PCR: NEGATIVE
SARS Coronavirus 2 by RT PCR: NEGATIVE

## 2024-11-18 LAB — BASIC METABOLIC PANEL WITH GFR
Anion gap: 19 — ABNORMAL HIGH (ref 5–15)
BUN: 13 mg/dL (ref 6–20)
BUN: 14 mg/dL (ref 6–20)
BUN: 11 mg/dL (ref 6–20)
CO2: <7 mmol/L — ABNORMAL LOW (ref 22–32)
CO2: <7 mmol/L — ABNORMAL LOW (ref 22–32)
CO2: 9 mmol/L — ABNORMAL LOW (ref 22–32)
Calcium: 8.5 mg/dL — ABNORMAL LOW (ref 8.9–10.3)
Calcium: 8.4 mg/dL — ABNORMAL LOW (ref 8.9–10.3)
Calcium: 8.2 mg/dL — ABNORMAL LOW (ref 8.9–10.3)
Chloride: 102 mmol/L (ref 98–111)
Chloride: 109 mmol/L (ref 98–111)
Chloride: 111 mmol/L (ref 98–111)
Creatinine, Ser: 0.81 mg/dL (ref 0.44–1.00)
Creatinine, Ser: 0.89 mg/dL (ref 0.44–1.00)
Creatinine, Ser: 0.86 mg/dL (ref 0.44–1.00)
GFR, Estimated: >60 mL/min
GFR, Estimated: >60 mL/min
GFR, Estimated: >60 mL/min
Glucose, Bld: 446 mg/dL — ABNORMAL HIGH (ref 70–99)
Glucose, Bld: 285 mg/dL — ABNORMAL HIGH (ref 70–99)
Glucose, Bld: 195 mg/dL — ABNORMAL HIGH (ref 70–99)
Potassium: 4.5 mmol/L (ref 3.5–5.1)
Potassium: 4.2 mmol/L (ref 3.5–5.1)
Potassium: 3.9 mmol/L (ref 3.5–5.1)
Sodium: 135 mmol/L (ref 135–145)
Sodium: 139 mmol/L (ref 135–145)
Sodium: 138 mmol/L (ref 135–145)

## 2024-11-18 LAB — HCG, SERUM, QUALITATIVE: Preg, Serum: NEGATIVE

## 2024-11-18 LAB — CBC
HCT: 48.7 % — ABNORMAL HIGH (ref 36.0–46.0)
Hemoglobin: 14.8 g/dL (ref 12.0–15.0)
MCH: 26.5 pg (ref 26.0–34.0)
MCHC: 30.4 g/dL (ref 30.0–36.0)
MCV: 87.3 fL (ref 80.0–100.0)
Platelets: 251 K/uL (ref 150–400)
RBC: 5.58 MIL/uL — ABNORMAL HIGH (ref 3.87–5.11)
RDW: 13.3 % (ref 11.5–15.5)
WBC: 17 K/uL — ABNORMAL HIGH (ref 4.0–10.5)
nRBC: 0 % (ref 0.0–0.2)

## 2024-11-18 LAB — HIV ANTIBODY (ROUTINE TESTING W REFLEX): HIV Screen 4th Generation wRfx: NONREACTIVE

## 2024-11-18 LAB — BETA-HYDROXYBUTYRIC ACID
Beta-Hydroxybutyric Acid: 6.74 mmol/L — ABNORMAL HIGH (ref 0.05–0.27)
Beta-Hydroxybutyric Acid: 6.62 mmol/L — ABNORMAL HIGH (ref 0.05–0.27)

## 2024-11-18 MED ORDER — OXYCODONE HCL 5 MG PO TABS: 5.0000 mg | ORAL_TABLET | ORAL | Status: DC | PRN

## 2024-11-18 MED ORDER — DEXTROSE IN LACTATED RINGERS 5 % IV SOLN: INTRAVENOUS | Status: AC

## 2024-11-18 MED ORDER — ACETAMINOPHEN 325 MG PO TABS
650.0000 mg | ORAL_TABLET | Freq: Four times a day (QID) | ORAL | Status: DC | PRN
  Administered 2024-11-20: 650 mg via ORAL
  Filled 2024-11-18: qty 2

## 2024-11-18 MED ORDER — LACTATED RINGERS IV BOLUS: 20.0000 mL/kg | Freq: Once | INTRAVENOUS | Status: DC

## 2024-11-18 MED ORDER — OSELTAMIVIR PHOSPHATE 75 MG PO CAPS
75.0000 mg | ORAL_CAPSULE | Freq: Two times a day (BID) | ORAL | Status: DC
  Administered 2024-11-18 – 2024-11-21 (×6): 75 mg via ORAL
  Filled 2024-11-18 (×6): qty 1

## 2024-11-18 MED ORDER — POTASSIUM CHLORIDE 10 MEQ/100ML IV SOLN
10.0000 meq | INTRAVENOUS | Status: AC
  Administered 2024-11-18 (×2): 10 meq via INTRAVENOUS
  Filled 2024-11-18 (×2): qty 100

## 2024-11-18 MED ORDER — MORPHINE SULFATE (PF) 4 MG/ML IV SOLN: 4.0000 mg | Freq: Once | INTRAVENOUS | Status: DC

## 2024-11-18 MED ORDER — ONDANSETRON HCL 4 MG PO TABS: 4.0000 mg | ORAL_TABLET | Freq: Four times a day (QID) | ORAL | Status: DC | PRN

## 2024-11-18 MED ORDER — POLYETHYLENE GLYCOL 3350 17 G PO PACK: 17.0000 g | PACK | Freq: Every day | ORAL | Status: DC | PRN

## 2024-11-18 MED ORDER — LACTATED RINGERS IV SOLN: INTRAVENOUS | Status: AC

## 2024-11-18 MED ORDER — LACTATED RINGERS IV BOLUS
1000.0000 mL | Freq: Once | INTRAVENOUS | Status: AC
  Administered 2024-11-18: 1000 mL via INTRAVENOUS

## 2024-11-18 MED ORDER — INSULIN REGULAR(HUMAN) IN NACL 100-0.9 UT/100ML-% IV SOLN
INTRAVENOUS | Status: DC
  Administered 2024-11-18: 10 [IU]/h via INTRAVENOUS
  Filled 2024-11-18: qty 100

## 2024-11-18 MED ORDER — ENOXAPARIN SODIUM 40 MG/0.4ML IJ SOSY
40.0000 mg | PREFILLED_SYRINGE | INTRAMUSCULAR | Status: DC
  Administered 2024-11-18 – 2024-11-20 (×3): 40 mg via SUBCUTANEOUS
  Filled 2024-11-18 (×3): qty 0.4

## 2024-11-18 MED ORDER — ACETAMINOPHEN 650 MG RE SUPP: 650.0000 mg | Freq: Four times a day (QID) | RECTAL | Status: DC | PRN

## 2024-11-18 MED ORDER — DEXTROSE 50 % IV SOLN: 0.0000 mL | INTRAVENOUS | Status: DC | PRN

## 2024-11-18 MED ORDER — INSULIN REGULAR(HUMAN) IN NACL 100-0.9 UT/100ML-% IV SOLN
INTRAVENOUS | Status: DC
  Administered 2024-11-19: 1.1 [IU]/h via INTRAVENOUS
  Filled 2024-11-18: qty 100

## 2024-11-18 MED ORDER — MORPHINE SULFATE (PF) 2 MG/ML IV SOLN
2.0000 mg | INTRAVENOUS | Status: DC | PRN
  Administered 2024-11-18 – 2024-11-19 (×3): 2 mg via INTRAVENOUS
  Filled 2024-11-18 (×3): qty 1

## 2024-11-18 MED ORDER — POTASSIUM CHLORIDE 10 MEQ/100ML IV SOLN: 10.0000 meq | INTRAVENOUS | Status: DC

## 2024-11-18 MED ORDER — MORPHINE SULFATE (PF) 4 MG/ML IV SOLN
4.0000 mg | Freq: Once | INTRAVENOUS | Status: AC
  Administered 2024-11-18: 4 mg via INTRAVENOUS
  Filled 2024-11-18: qty 1

## 2024-11-18 MED ORDER — DEXTROSE IN LACTATED RINGERS 5 % IV SOLN: INTRAVENOUS | Status: DC

## 2024-11-18 MED ORDER — ONDANSETRON HCL 4 MG/2ML IJ SOLN
4.0000 mg | Freq: Four times a day (QID) | INTRAMUSCULAR | Status: DC | PRN
  Administered 2024-11-19 – 2024-11-20 (×2): 4 mg via INTRAVENOUS
  Filled 2024-11-18 (×2): qty 2

## 2024-11-18 MED ORDER — LACTATED RINGERS IV SOLN: INTRAVENOUS | Status: DC

## 2024-11-18 MED ORDER — LACTATED RINGERS IV BOLUS
20.0000 mL/kg | Freq: Once | INTRAVENOUS | Status: AC
  Administered 2024-11-18: 1360 mL via INTRAVENOUS

## 2024-11-18 NOTE — ED Notes (Signed)
 Pt's mother and boyfriend would like to be notified when pt is being taken to her room upon admission. Contact information: [place] (boyfriend): [phone] and knotte[place] [phone]

## 2024-11-18 NOTE — ED Provider Notes (Signed)
 Patient seen after prior EDP.   Patient would benefit from admission.   Hospitalist ([facility]) service is aware of case.    Laurice Maude BROCKS, MD 11/18/24 [phone]

## 2024-11-18 NOTE — ED Provider Notes (Signed)
 " [place] EMERGENCY DEPARTMENT AT [facility] Provider Note   CSN: [id number] Arrival date & time: 11/18/24  1347     Patient presents with: Hyperglycemia and Flu symptoms    Kristina Sparks is a 23 y.o. female.   Patient is a 23 year old female with a history of type 1 diabetes who reports she has not used any insulin  since April but prior to that was buying over-the-counter insulin  who is presenting today with multiple complaints.  Patient reports on Christmas Eve she started having cough, congestion and myalgias.  She reports her mom was sick first and then she developed symptoms.  However in the last 2 to 3 days she started having shortness of breath, chest discomfort, severe nausea, feeling dizzy and having a severely dry mouth.  She also started her menses this week which she reports is the second period she has had this month.  She has not been taking over-the-counter medications.  Denies any tobacco or alcohol use.  Has not had any vomiting or diarrhea.  The history is provided by the patient.  Hyperglycemia      Prior to Admission medications  Medication Sig Start Date End Date Taking? Authorizing Provider  blood glucose meter kit and supplies KIT Dispense based on patient and insurance preference. Use up to four times daily as directed. Patient not taking: Reported on 07/17/2024 09/01/21   Krishnan, Gokul, MD  Boric Acid [address] 600 mg vaginally at bedtime. Use vaginally every night for two weeks then twice a week for six months 07/20/24   Anyanwu, Ugonna A, MD  fluconazole  (DIFLUCAN ) 150 MG tablet Take one tablet by mouth every three days for three doses, then take one tablet once a week for six months 07/17/24   Anyanwu, Ugonna A, MD  insulin  aspart protamine - aspart (NOVOLOG  70/30 MIX) (70-30) 100 UNIT/ML FlexPen Inject 16 Units into the skin 2 (two) times daily. 10/12/23   Towana Joen HERO, PA-C  nystatin  cream (MYCOSTATIN ) Apply 1 Application topically 2  (two) times daily. 07/17/24   Anyanwu, Ugonna A, MD    Allergies: Patient has no known allergies.    Review of Systems  Updated Vital Signs BP 129/78 (BP Location: Left Arm)   Pulse (!) 113   Temp 98.3 F (36.8 C) (Oral)   Resp (!) 23   Ht 5' 7 (1.702 m)   Wt 68 kg   LMP 11/17/2024   SpO2 100%   BMI 23.49 kg/m   Physical Exam Vitals and nursing note reviewed.  Constitutional:      General: She is not in acute distress.    Appearance: She is well-developed. She is ill-appearing.     Comments: Smells of ketones  HENT:     Head: Normocephalic and atraumatic.     Mouth/Throat:     Mouth: Mucous membranes are dry.  Eyes:     Pupils: Pupils are equal, round, and reactive to light.  Cardiovascular:     Rate and Rhythm: Regular rhythm. Tachycardia present.     Heart sounds: Normal heart sounds. No murmur heard.    No friction rub.  Pulmonary:     Effort: Pulmonary effort is normal. Tachypnea present.     Breath sounds: Normal breath sounds. No wheezing or rales.  Abdominal:     General: Abdomen is flat. Bowel sounds are normal. There is no distension.     Palpations: Abdomen is soft.     Tenderness: There is no abdominal tenderness. There  is no guarding or rebound.  Musculoskeletal:        General: No tenderness. Normal range of motion.     Right lower leg: No edema.     Left lower leg: No edema.     Comments: No edema  Skin:    General: Skin is warm and dry.     Findings: No rash.  Neurological:     Mental Status: She is alert and oriented to person, place, and time.     Cranial Nerves: No cranial nerve deficit.  Psychiatric:        Behavior: Behavior normal.     (all labs ordered are listed, but only abnormal results are displayed) Labs Reviewed  BASIC METABOLIC PANEL WITH GFR - Abnormal; Notable for the following components:      Result Value   CO2 <7 (*)    Glucose, Bld 446 (*)    Calcium 8.5 (*)    All other components within normal limits  URINALYSIS,  ROUTINE W REFLEX MICROSCOPIC - Abnormal; Notable for the following components:   Color, Urine STRAW (*)    Glucose, UA >=500 (*)    Hgb urine dipstick SMALL (*)    Ketones, ur 80 (*)    Protein, ur 100 (*)    All other components within normal limits  BASIC METABOLIC PANEL WITH GFR  BASIC METABOLIC PANEL WITH GFR  BASIC METABOLIC PANEL WITH GFR  BETA-HYDROXYBUTYRIC ACID  BETA-HYDROXYBUTYRIC ACID  BETA-HYDROXYBUTYRIC ACID  BETA-HYDROXYBUTYRIC ACID  CBC WITH DIFFERENTIAL/PLATELET  BLOOD GAS, VENOUS  CBC WITH DIFFERENTIAL/PLATELET  HCG, SERUM, QUALITATIVE  CBG MONITORING, ED    EKG: EKG Interpretation Date/Time:  Monday November 18 2024 15:38:45 EST Ventricular Rate:  115 PR Interval:  130 QRS Duration:  74 QT Interval:  353 QTC Calculation: 489 R Axis:   47  Text Interpretation: Sinus tachycardia Ventricular premature complex Abnormal Q suggests anterior infarct Minimal ST depression, inferior leads Borderline prolonged QT interval No significant change since last tracing Confirmed by Doretha Folks ([id number]) on 11/18/2024 3:49:04 PM  Radiology: [facility] Chest Port 1 View Result Date: 11/18/2024 CLINICAL DATA:  Shortness of breath.  Productive cough. EXAM: PORTABLE CHEST 1 VIEW COMPARISON:  12/12/2007 FINDINGS: The cardiomediastinal contours are normal. The lungs are clear. Pulmonary vasculature is normal. No consolidation, pleural effusion, or pneumothorax. No acute osseous abnormalities are seen. IMPRESSION: No active disease. Electronically Signed   By: Andrea Gasman M.D.   On: 11/18/2024 14:57     Procedures   Medications Ordered in the ED  lactated ringers  bolus 1,360 mL (1,360 mLs Intravenous New Bag/Given 11/18/24 1441)  morphine  (PF) 4 MG/ML injection 4 mg (4 mg Intravenous Given 11/18/24 1518)                                    Medical Decision Making Amount and/or Complexity of Data Reviewed Labs: ordered. Radiology: ordered.  Risk Prescription drug  management.   Pt with multiple medical problems and comorbidities and presenting today with a complaint that caries a high risk for morbidity and mortality.  Presenting here today with the above complaints.  Concern for DKA today with tachycardia, tachypnea and the smell of ketones.  Blood sugar here was in the 400s on fingerstick.  Patient reports she is not taking insulin  at this time.  Also concern for possible influenza type illness but also concern for possible pneumonia.  Patient not having any  urinary symptoms suggestive of a UTI.  Patient is not having any vomiting low suspicion for bowel obstruction and has soft nontender abdomen.  Low suspicion for acute cardiac issue.  Patient is satting at 100% on room air.  Started on IV fluids, antiemetics.  I have independently visualized and interpreted pt's images today.  CXR wnl.  I independently interpreted patient's labs and EKG.  EKG with sinus tachycardia and prolonged QT unchanged.  BMP with CO2 of less than 7 and anion gap on calculable.  Potassium is 4.5 but there is hemolysis, creatinine is normal.  UA without infection but does have ketones present.     Final diagnoses:  None    ED Discharge Orders     None          Doretha Folks, MD 11/18/24 1553  "

## 2024-11-18 NOTE — H&P (Signed)
 "  History and Physical    Kristina Sparks FMW:[id number] DOB: 05-Aug-2001 DOA: 11/18/2024  DOS: the patient was seen and examined on 11/18/2024  PCP: Pcp, No   Patient coming from: Home  I have personally briefly reviewed patient's old medical records in [facility]  Chief Complaint: High blood sugars and flulike symptoms  HPI: Kristina Sparks is a pleasant 23 y.o. female with medical history significant for diabetes not taking medications since April 2025 who presented to [facility] emergency room for cough congestion and myalgias started on Christmas Eve.  She reports her mom was sick first and then she developed symptoms.  However, in the last 2 to 3 days she started having shortness of breath, chest discomfort, severe nausea, feeling weak and dizzy and dry mouth.  She also started her menses this week which she reports that this is the second time she had a menses this month.  She had not been taking any over-the-counter medications.  She denies any tobacco or alcohol use.  She denies any drugs.  She denies any nausea vomiting or diarrhea.  She denies any fever or chills.  ED Course: Upon arrival to the ED, patient is found to be tachycardic, hyperglycemic with blood sugar was 446, bicarb was less than 7, anion gap not able to be calculated, VBG pending, chest x-ray without any acute finding, urine analysis without any signs of infection, beta-hydroxybutyrate was 6.74.  Patient was started on IV fluid, insulin  drip.  Hospitalist service was consulted for evaluation for admission for DKA.  Review of Systems:  ROS  All other systems negative except as noted in the HPI.  Past Medical History:  Diagnosis Date   Atypical squamous cells of undetermined significance (ASCUS) on Papanicolaou smear of cervix    needs repeat Pap smear in 06/2024   Diabetes mellitus type 1, uncomplicated (HCC)    Vitamin D  deficiency     History reviewed. No pertinent surgical history.    reports that she has never smoked. She has never used smokeless tobacco. She reports current alcohol use. She reports current drug use. Drug: Marijuana.  Allergies[1]  Family History  Problem Relation Age of Onset   Cancer Paternal Grandmother    Breast cancer Maternal Grandmother    Throat cancer Maternal Grandmother    Crohn's disease Maternal Uncle     Prior to Admission medications  Medication Sig Start Date End Date Taking? Authorizing Provider  blood glucose meter kit and supplies KIT Dispense based on patient and insurance preference. Use up to four times daily as directed. Patient not taking: Reported on 07/17/2024 09/01/21   Krishnan, Gokul, MD  Boric Acid [facility] 600 mg vaginally at bedtime. Use vaginally every night for two weeks then twice a week for six months 07/20/24   Anyanwu, Ugonna A, MD  fluconazole  (DIFLUCAN ) 150 MG tablet Take one tablet by mouth every three days for three doses, then take one tablet once a week for six months 07/17/24   Anyanwu, Ugonna A, MD  insulin  aspart protamine - aspart (NOVOLOG  70/30 MIX) (70-30) 100 UNIT/ML FlexPen Inject 16 Units into the skin 2 (two) times daily. 10/12/23   Towana Joen HERO, PA-C  nystatin  cream (MYCOSTATIN ) Apply 1 Application topically 2 (two) times daily. 07/17/24   Herchel Gloris LABOR, MD    Physical Exam: Vitals:   11/18/24 1407 11/18/24 1539 11/18/24 1715 11/18/24 1943  BP:  129/78 (!) 137/93 126/81  Pulse:  (!) 113 ROLLEN)  135 (!) 120  Resp:  (!) 23 (!) 26 19  Temp:  98.3 F (36.8 C)  98.4 F (36.9 C)  TempSrc:  Oral  Oral  SpO2:  100% 100% 100%  Weight: 68 kg     Height: 5' 7 (1.702 m)       Physical Exam   Constitutional: Alert, awake, calm, comfortable HEENT: Neck supple, dry mouth Respiratory: Clear to auscultation B/L, no wheezing, no rales.  Cardiovascular: Regular rate and rhythm, no murmurs / rubs / gallops. No extremity edema. 2+ pedal pulses. No carotid bruits.  Abdomen: Soft, no tenderness,  Bowel sounds positive.  Musculoskeletal: no clubbing / cyanosis. Good ROM, no contractures. Normal muscle tone.  Skin: no rashes, lesions, ulcers. Neurologic: CN 2-12 grossly intact. Sensation intact, No focal deficit identified Psychiatric: Alert and oriented x 3. Normal mood.    Labs on Admission: I have personally reviewed following labs and imaging studies  CBC: Recent Labs  Lab 11/18/24 1538 11/18/24 1852  WBC 16.0* 17.0*  NEUTROABS 12.9*  --   HGB 15.5* 14.8  HCT 50.6* 48.7*  MCV 87.5 87.3  PLT 287 251   Basic Metabolic Panel: Recent Labs  Lab 11/18/24 1503 11/18/24 1851  NA 135 139  K 4.5 4.2  CL 102 109  CO2 <7* <7*  GLUCOSE 446* 285*  BUN 13 14  CREATININE 0.81 0.89  CALCIUM 8.5* 8.4*   GFR: Estimated Creatinine Clearance: 96.4 mL/min (by C-G formula based on SCr of 0.89 mg/dL). Liver Function Tests: No results for input(s): AST, ALT, ALKPHOS, BILITOT, PROT, ALBUMIN in the last 168 hours. No results for input(s): LIPASE, AMYLASE in the last 168 hours. No results for input(s): AMMONIA in the last 168 hours. Coagulation Profile: No results for input(s): INR, PROTIME in the last 168 hours. Cardiac Enzymes: No results for input(s): CKTOTAL, CKMB, CKMBINDEX, TROPONINI, TROPONINIHS in the last 168 hours. BNP (last 3 results) No results for input(s): BNP in the last 8760 hours. HbA1C: No results for input(s): HGBA1C in the last 72 hours. CBG: Recent Labs  Lab 11/18/24 1437 11/18/24 1716 11/18/24 1804 11/18/24 1913 11/18/24 2018  GLUCAP 523* 433* 338* 192* 197*   Lipid Profile: No results for input(s): CHOL, HDL, LDLCALC, TRIG, CHOLHDL, LDLDIRECT in the last 72 hours. Thyroid  Function Tests: No results for input(s): TSH, T4TOTAL, FREET4, T3FREE, THYROIDAB in the last 72 hours. Anemia Panel: No results for input(s): VITAMINB12, FOLATE, FERRITIN, TIBC, IRON, RETICCTPCT in the last 72  hours. Urine analysis:    Component Value Date/Time   COLORURINE STRAW (A) 11/18/2024 1510   APPEARANCEUR CLEAR 11/18/2024 1510   LABSPEC 1.024 11/18/2024 1510   PHURINE 5.0 11/18/2024 1510   GLUCOSEU >=500 (A) 11/18/2024 1510   HGBUR SMALL (A) 11/18/2024 1510   BILIRUBINUR NEGATIVE 11/18/2024 1510   KETONESUR 80 (A) 11/18/2024 1510   PROTEINUR 100 (A) 11/18/2024 1510   UROBILINOGEN 0.2 09/17/2019 1612   NITRITE NEGATIVE 11/18/2024 1510   LEUKOCYTESUR NEGATIVE 11/18/2024 1510    Radiological Exams on Admission: I have personally reviewed images DG Chest Port 1 View Result Date: 11/18/2024 CLINICAL DATA:  Shortness of breath.  Productive cough. EXAM: PORTABLE CHEST 1 VIEW COMPARISON:  12/12/2007 FINDINGS: The cardiomediastinal contours are normal. The lungs are clear. Pulmonary vasculature is normal. No consolidation, pleural effusion, or pneumothorax. No acute osseous abnormalities are seen. IMPRESSION: No active disease. Electronically Signed   By: Andrea Gasman M.D.   On: 11/18/2024 14:57    EKG: My personal interpretation  of EKG shows: Sinus tachycardia at 115 bpm no ST elevation    Assessment/Plan Principal Problem:   DKA (diabetic ketoacidosis) (HCC)    Assessment and Plan: 23 year old female with a diagnosis of diabetes type 1 since October of 2022 initially prescribed insulin  but not taking any medications at this point due to lack of insurance.  She came into ED complaining of cough cold congestion and high blood sugars.  1.  Acute diabetic ketoacidosis - This may be related to not taking medications. - She will be admitted to hospital as inpatient in the stepdown unit for diabetic ketoacidosis treatment with insulin  drip. - She has been started on insulin  drip in the emergency room. - Will continue insulin  drip per protocol. - She will be n.p.o. - She was given 2 L of IV fluid in the emergency room and was plan to give her 3 L. - Will transition her to  subcutaneous insulin  once gap is closed and acidosis resolves. - She will be checked with BMP per protocol. - Extensive counseling was done regarding insulin /medication compliance.  Patient's mother and mother's boyfriend also were at bedside.  2. Influenzae A Infection. - She does not have fever. - She has leukocytosis may be related to flu - Chest x-ray and urine analysis have been negative. - She is positive for flu A - Will give her Tamiflu , droplet isolation - No need for antibiotic at this point    DVT prophylaxis: Lovenox  Code Status: Full Code Family Communication: Mother and her boyfriend at bedside Disposition Plan: Home Consults called: None Admission status: Inpatient, Step Down Unit   Nena Rebel, MD [facility] 11/18/2024, 8:43 PM        [1] No Known Allergies  "

## 2024-11-18 NOTE — ED Notes (Signed)
 Provider Dr. Doretha notified of blood CBG of 523 done at 2:45pm. Provider notified face to face.

## 2024-11-18 NOTE — ED Notes (Signed)
 Attempted to start IV access, unsuccessful, blood return noted but blood coming out too slowly, clotting inside tube

## 2024-11-18 NOTE — ED Triage Notes (Signed)
 Pt BIB ems for flu like s/s and hyperglecemia. Pt experiencing nausea, vomiting, and cough since christmas. Pt abdominal pain 10/10. Pt type I Diabetic, noncompliant wit her home meds.  Ems CBG 423, given 250ml LR and 4mg  zofran .

## 2024-11-18 NOTE — ED Notes (Signed)
 Provider, Maude Galloway, MD notified of pt increase in HR in the 130s and increase in respiration rate in high 20s to 30s. Current Vital signs: HR135, RR 26, 137/93, T 98.3

## 2024-11-19 ENCOUNTER — Inpatient Hospital Stay (HOSPITAL_COMMUNITY)

## 2024-11-19 LAB — BASIC METABOLIC PANEL WITH GFR
Anion gap: 14 (ref 5–15)
Anion gap: 12 (ref 5–15)
Anion gap: 13 (ref 5–15)
Anion gap: 18 — ABNORMAL HIGH (ref 5–15)
Anion gap: 14 (ref 5–15)
Anion gap: 11 (ref 5–15)
BUN: 10 mg/dL (ref 6–20)
BUN: 10 mg/dL (ref 6–20)
BUN: 10 mg/dL (ref 6–20)
BUN: 8 mg/dL (ref 6–20)
BUN: 6 mg/dL (ref 6–20)
BUN: 5 mg/dL — ABNORMAL LOW (ref 6–20)
CO2: 11 mmol/L — ABNORMAL LOW (ref 22–32)
CO2: 14 mmol/L — ABNORMAL LOW (ref 22–32)
CO2: 15 mmol/L — ABNORMAL LOW (ref 22–32)
CO2: 12 mmol/L — ABNORMAL LOW (ref 22–32)
CO2: 15 mmol/L — ABNORMAL LOW (ref 22–32)
CO2: 17 mmol/L — ABNORMAL LOW (ref 22–32)
Calcium: 8 mg/dL — ABNORMAL LOW (ref 8.9–10.3)
Calcium: 8.1 mg/dL — ABNORMAL LOW (ref 8.9–10.3)
Calcium: 8 mg/dL — ABNORMAL LOW (ref 8.9–10.3)
Calcium: 8.3 mg/dL — ABNORMAL LOW (ref 8.9–10.3)
Calcium: 8 mg/dL — ABNORMAL LOW (ref 8.9–10.3)
Calcium: 7.7 mg/dL — ABNORMAL LOW (ref 8.9–10.3)
Chloride: 114 mmol/L — ABNORMAL HIGH (ref 98–111)
Chloride: 115 mmol/L — ABNORMAL HIGH (ref 98–111)
Chloride: 113 mmol/L — ABNORMAL HIGH (ref 98–111)
Chloride: 109 mmol/L (ref 98–111)
Chloride: 110 mmol/L (ref 98–111)
Chloride: 112 mmol/L — ABNORMAL HIGH (ref 98–111)
Creatinine, Ser: 0.73 mg/dL (ref 0.44–1.00)
Creatinine, Ser: 0.65 mg/dL (ref 0.44–1.00)
Creatinine, Ser: 0.68 mg/dL (ref 0.44–1.00)
Creatinine, Ser: 0.68 mg/dL (ref 0.44–1.00)
Creatinine, Ser: 0.64 mg/dL (ref 0.44–1.00)
Creatinine, Ser: 0.51 mg/dL (ref 0.44–1.00)
GFR, Estimated: >60 mL/min
GFR, Estimated: >60 mL/min
GFR, Estimated: >60 mL/min
GFR, Estimated: >60 mL/min
GFR, Estimated: >60 mL/min
GFR, Estimated: >60 mL/min
Glucose, Bld: 193 mg/dL — ABNORMAL HIGH (ref 70–99)
Glucose, Bld: 192 mg/dL — ABNORMAL HIGH (ref 70–99)
Glucose, Bld: 179 mg/dL — ABNORMAL HIGH (ref 70–99)
Glucose, Bld: 219 mg/dL — ABNORMAL HIGH (ref 70–99)
Glucose, Bld: 243 mg/dL — ABNORMAL HIGH (ref 70–99)
Glucose, Bld: 187 mg/dL — ABNORMAL HIGH (ref 70–99)
Potassium: 3.5 mmol/L (ref 3.5–5.1)
Potassium: 3.3 mmol/L — ABNORMAL LOW (ref 3.5–5.1)
Potassium: 3.2 mmol/L — ABNORMAL LOW (ref 3.5–5.1)
Potassium: 4 mmol/L (ref 3.5–5.1)
Potassium: 3.1 mmol/L — ABNORMAL LOW (ref 3.5–5.1)
Potassium: 3 mmol/L — ABNORMAL LOW (ref 3.5–5.1)
Sodium: 139 mmol/L (ref 135–145)
Sodium: 141 mmol/L (ref 135–145)
Sodium: 141 mmol/L (ref 135–145)
Sodium: 140 mmol/L (ref 135–145)
Sodium: 139 mmol/L (ref 135–145)
Sodium: 140 mmol/L (ref 135–145)

## 2024-11-19 LAB — RESPIRATORY PANEL BY PCR

## 2024-11-19 LAB — CBG MONITORING, ED
Glucose-Capillary: 145 mg/dL — ABNORMAL HIGH (ref 70–99)
Glucose-Capillary: 162 mg/dL — ABNORMAL HIGH (ref 70–99)
Glucose-Capillary: 163 mg/dL — ABNORMAL HIGH (ref 70–99)
Glucose-Capillary: 180 mg/dL — ABNORMAL HIGH (ref 70–99)
Glucose-Capillary: 182 mg/dL — ABNORMAL HIGH (ref 70–99)
Glucose-Capillary: 186 mg/dL — ABNORMAL HIGH (ref 70–99)
Glucose-Capillary: 190 mg/dL — ABNORMAL HIGH (ref 70–99)
Glucose-Capillary: 191 mg/dL — ABNORMAL HIGH (ref 70–99)
Glucose-Capillary: 196 mg/dL — ABNORMAL HIGH (ref 70–99)
Glucose-Capillary: 198 mg/dL — ABNORMAL HIGH (ref 70–99)
Glucose-Capillary: 208 mg/dL — ABNORMAL HIGH (ref 70–99)
Glucose-Capillary: 212 mg/dL — ABNORMAL HIGH (ref 70–99)
Glucose-Capillary: 215 mg/dL — ABNORMAL HIGH (ref 70–99)
Glucose-Capillary: 229 mg/dL — ABNORMAL HIGH (ref 70–99)
Glucose-Capillary: 263 mg/dL — ABNORMAL HIGH (ref 70–99)
Glucose-Capillary: 281 mg/dL — ABNORMAL HIGH (ref 70–99)

## 2024-11-19 LAB — MAGNESIUM: Magnesium: 1.8 mg/dL (ref 1.7–2.4)

## 2024-11-19 LAB — HEMOGLOBIN A1C
Hgb A1c MFr Bld: 16.2 % — ABNORMAL HIGH (ref 4.8–5.6)
Mean Plasma Glucose: 418.24 mg/dL

## 2024-11-19 LAB — BETA-HYDROXYBUTYRIC ACID
Beta-Hydroxybutyric Acid: 6.64 mmol/L — ABNORMAL HIGH (ref 0.05–0.27)
Beta-Hydroxybutyric Acid: 2.35 mmol/L — ABNORMAL HIGH (ref 0.05–0.27)
Beta-Hydroxybutyric Acid: 1.5 mmol/L — ABNORMAL HIGH (ref 0.05–0.27)
Beta-Hydroxybutyric Acid: 1.58 mmol/L — ABNORMAL HIGH (ref 0.05–0.27)

## 2024-11-19 LAB — GLUCOSE, CAPILLARY
Glucose-Capillary: 154 mg/dL — ABNORMAL HIGH (ref 70–99)
Glucose-Capillary: 170 mg/dL — ABNORMAL HIGH (ref 70–99)
Glucose-Capillary: 176 mg/dL — ABNORMAL HIGH (ref 70–99)
Glucose-Capillary: 196 mg/dL — ABNORMAL HIGH (ref 70–99)

## 2024-11-19 LAB — MRSA NEXT GEN BY PCR, NASAL: MRSA by PCR Next Gen: NOT DETECTED

## 2024-11-19 MED ORDER — ORAL CARE MOUTH RINSE: 15.0000 mL | OROMUCOSAL | Status: DC | PRN

## 2024-11-19 MED ORDER — SODIUM CHLORIDE 0.9 % IV SOLN
2.0000 g | INTRAVENOUS | Status: DC
  Administered 2024-11-19: 2 g via INTRAVENOUS
  Filled 2024-11-19: qty 20

## 2024-11-19 MED ORDER — POTASSIUM CHLORIDE CRYS ER 20 MEQ PO TBCR
40.0000 meq | EXTENDED_RELEASE_TABLET | Freq: Once | ORAL | Status: AC
  Administered 2024-11-19: 40 meq via ORAL
  Filled 2024-11-19: qty 2

## 2024-11-19 MED ORDER — GUAIFENESIN ER 600 MG PO TB12
1200.0000 mg | ORAL_TABLET | Freq: Two times a day (BID) | ORAL | Status: DC
  Administered 2024-11-19 – 2024-11-21 (×5): 1200 mg via ORAL
  Filled 2024-11-19 (×5): qty 2

## 2024-11-19 MED ORDER — IOHEXOL 350 MG/ML SOLN
75.0000 mL | Freq: Once | INTRAVENOUS | Status: AC | PRN
  Administered 2024-11-19: 75 mL via INTRAVENOUS

## 2024-11-19 MED ORDER — PANTOPRAZOLE SODIUM 40 MG IV SOLR
40.0000 mg | Freq: Two times a day (BID) | INTRAVENOUS | Status: DC
  Administered 2024-11-19 – 2024-11-21 (×5): 40 mg via INTRAVENOUS
  Filled 2024-11-19 (×5): qty 10

## 2024-11-19 MED ORDER — SODIUM CHLORIDE 0.9 % IV BOLUS
500.0000 mL | Freq: Once | INTRAVENOUS | Status: AC
  Administered 2024-11-19: 500 mL via INTRAVENOUS

## 2024-11-19 MED ORDER — POTASSIUM CHLORIDE 10 MEQ/100ML IV SOLN
10.0000 meq | INTRAVENOUS | Status: AC
  Administered 2024-11-19 (×3): 10 meq via INTRAVENOUS
  Filled 2024-11-19 (×3): qty 100

## 2024-11-19 MED ORDER — POTASSIUM CHLORIDE CRYS ER 20 MEQ PO TBCR
20.0000 meq | EXTENDED_RELEASE_TABLET | Freq: Once | ORAL | Status: AC
  Administered 2024-11-19: 20 meq via ORAL
  Filled 2024-11-19: qty 1

## 2024-11-19 MED ORDER — CHLORHEXIDINE GLUCONATE CLOTH 2 % EX PADS
6.0000 | MEDICATED_PAD | Freq: Every day | CUTANEOUS | Status: DC
  Administered 2024-11-19 – 2024-11-21 (×3): 6 via TOPICAL

## 2024-11-19 MED ORDER — AZITHROMYCIN 250 MG PO TABS
500.0000 mg | ORAL_TABLET | Freq: Every day | ORAL | Status: DC
  Administered 2024-11-19: 500 mg via ORAL
  Filled 2024-11-19: qty 2

## 2024-11-19 MED ORDER — POTASSIUM CHLORIDE CRYS ER 20 MEQ PO TBCR: 40.0000 meq | EXTENDED_RELEASE_TABLET | Freq: Once | ORAL | Status: DC

## 2024-11-19 MED ORDER — IPRATROPIUM-ALBUTEROL 0.5-2.5 (3) MG/3ML IN SOLN
3.0000 mL | Freq: Two times a day (BID) | RESPIRATORY_TRACT | Status: DC
  Administered 2024-11-19 – 2024-11-21 (×4): 3 mL via RESPIRATORY_TRACT
  Filled 2024-11-19 (×4): qty 3

## 2024-11-19 MED ORDER — DEXTROSE IN LACTATED RINGERS 5 % IV SOLN: INTRAVENOUS | Status: DC

## 2024-11-19 MED ORDER — POTASSIUM CHLORIDE 10 MEQ/100ML IV SOLN
10.0000 meq | INTRAVENOUS | Status: AC
  Administered 2024-11-19 (×2): 10 meq via INTRAVENOUS
  Filled 2024-11-19 (×2): qty 100

## 2024-11-19 NOTE — ED Notes (Signed)
 Regalado MD made aware of pt K+ 3.1

## 2024-11-19 NOTE — Progress Notes (Signed)
 Patient arrived to unit alert and oriented x4 at approximately 1848. Insulin  and D5 LR infusing at this time. Patient in bed with bed alarm on. Report given to night RN.

## 2024-11-19 NOTE — Progress Notes (Signed)
" ° °  Brief Progress Note   _____________________________________________________________________________________________________________  Patient Name: Kristina Sparks Patient DOB: 2001-07-23 Date: @TODAY @      Data: Reviewed labs, VS, notes.     Action: No action needed at this time.     Response:    _____________________________________________________________________________________________________________  [facility] RN Expeditor Sharolyn JONETTA Batman Please contact us  directly via secure chat (search for [facility]) or by calling us  at [phone] [facility]).  "

## 2024-11-19 NOTE — Inpatient Diabetes Management (Signed)
 Inpatient Diabetes Program Recommendations  AACE/ADA: New Consensus Statement on Inpatient Glycemic Control (2015)  Target Ranges:  Prepandial:   less than 140 mg/dL      Peak postprandial:   less than 180 mg/dL (1-2 hours)      Critically ill patients:  140 - 180 mg/dL   Lab Results  Component Value Date   GLUCAP 263 (H) 11/19/2024   HGBA1C 16.2 (H) 11/18/2024    Review of Glycemic Control  Diabetes history: DM1, diagnosed 08/2021 Outpatient Diabetes medications: None since 02/2024 Current orders for Inpatient glycemic control: IV insulin  per [facility] for DKA  Likely still has some insulin  production given she hasn't used insulin  in approx 8 months  Inpatient Diabetes Program Recommendations:    When criteria met for discontinuation of drip, give Semglee  1-2 H prior to stopping drip  Semglee  8 units BID  Novolog  0-9 TID with meals and 0-5 HS  Novolog  3 units TID with meals if eating > 50%  CHO mod diet  TOC consult for PCP/Endo  Spoke with pt and Mom at bedside in ED regarding her diabetes and HgbA1C of 16.2%, with average daily blood sugar of 418 mg/dL.

## 2024-11-19 NOTE — Progress Notes (Signed)
 " PROGRESS NOTE    Kristina Sparks  FMW:[id number] DOB: Apr 29, 2001 DOA: 11/18/2024 PCP: Pcp, No   Brief Narrative: 23 year old with past medical history diabetes not taking medication since April 2025 presented to [facility] complaining of cough congestion myalgia that is static Christmas Eve.  Over the last 2 to 3 days he has started to have shortness of breath, chest discomfort, nausea feeling weak and dizzy.  Evaluation in the ED she was found to be in DKA with a bicarb less than 7 blood sugar 446 influenza A+   Assessment & Plan:   Principal Problem:   DKA (diabetic ketoacidosis) (HCC)  1-Diabetes Type 1, DKA:  - Patient presented with hyperglycemia, influenza, found to be in DKA.  Bicarb less than 7, gap 19, Beta- hydroxybutyric  acid.  - Continue with IV fluids and insulin  drip - Transition  to long-acting insulin  when bicarb more than 20 and gap is close - Continue BP meds every 4 hours Replete potassium as needed  2-Influenza A Infection:  - Continue with Tamiflu  for 5 days - Due to leukocytosis and fever, will cover for superimposed bacterial infection.   3-Chest pain:  Report pleuritic chest pain not necessarily associated with cough -CT angio negative for PE  Leukocytosis.  Follow trend Started on IV ceftriaxone  and azithromycin    Estimated body mass index is 23.49 kg/m as calculated from the following:   Height as of this encounter: 5' 7 (1.702 m).   Weight as of this encounter: 68 kg.   DVT prophylaxis: Lovenox  Code Status: Full code Family Communication: Discussed with patient mother who was at bedside Disposition Plan:  Status is: Inpatient Remains inpatient appropriate because: Management of DKA    Consultants:  None  Procedures:  none  Antimicrobials:    Subjective: She is alert, report chest pain, intermittent. Sharp, pleuritic.  Also report Burning chest pain.   Objective: Vitals:   11/19/24 0330 11/19/24 0500 11/19/24 0645  11/19/24 0728  BP: 111/76 111/69 108/72   Pulse: (!) 112 (!) 114 (!) 108   Resp: 16 10 11    Temp: 99 F (37.2 C)   99.4 F (37.4 C)  TempSrc: Oral   Oral  SpO2: 100% 99% 97%   Weight:      Height:        Intake/Output Summary (Last 24 hours) at 11/19/2024 0847 Last data filed at 11/18/2024 1642 Gross per 24 hour  Intake 1360 ml  Output --  Net 1360 ml   Filed Weights   11/18/24 1407  Weight: 68 kg    Examination:  General exam: Appears calm and comfortable , appears weak, ill  Respiratory system: Clear to auscultation. Respiratory effort normal. Cardiovascular system: S1 & S2 heard, RRR. No JVD, murmurs, rubs, gallops or clicks. No pedal edema. Gastrointestinal system: Abdomen is nondistended, soft and nontender. No organomegaly or masses felt. Normal bowel sounds heard. Central nervous system: Alert and oriented Extremities: Symmetric 5 x 5 power.  Data Reviewed: I have personally reviewed following labs and imaging studies  CBC: Recent Labs  Lab 11/18/24 1538 11/18/24 1852  WBC 16.0* 17.0*  NEUTROABS 12.9*  --   HGB 15.5* 14.8  HCT 50.6* 48.7*  MCV 87.5 87.3  PLT 287 251   Basic Metabolic Panel: Recent Labs  Lab 11/18/24 1503 11/18/24 1851 11/18/24 2304 11/19/24 0203 11/19/24 0606  NA 135 139 138 139 141  K 4.5 4.2 3.9 3.5 3.3*  CL 102 109 111 114* 115*  CO2 <7* <7* 9* 11* 14*  GLUCOSE 446* 285* 195* 193* 192*  BUN 13 14 11 10 10   CREATININE 0.81 0.89 0.86 0.73 0.65  CALCIUM 8.5* 8.4* 8.2* 8.0* 8.1*  MG  --   --  1.8  --   --    GFR: Estimated Creatinine Clearance: 107.3 mL/min (by C-G formula based on SCr of 0.65 mg/dL). Liver Function Tests: No results for input(s): AST, ALT, ALKPHOS, BILITOT, PROT, ALBUMIN in the last 168 hours. No results for input(s): LIPASE, AMYLASE in the last 168 hours. No results for input(s): AMMONIA in the last 168 hours. Coagulation Profile: No results for input(s): INR, PROTIME in the last  168 hours. Cardiac Enzymes: No results for input(s): CKTOTAL, CKMB, CKMBINDEX, TROPONINI in the last 168 hours. BNP (last 3 results) No results for input(s): PROBNP in the last 8760 hours. HbA1C: Recent Labs    11/18/24 1850  HGBA1C 16.2*   CBG: Recent Labs  Lab 11/19/24 0357 11/19/24 0457 11/19/24 0601 11/19/24 0658 11/19/24 0808  GLUCAP 186* 191* 196* 190* 163*   Lipid Profile: No results for input(s): CHOL, HDL, LDLCALC, TRIG, CHOLHDL, LDLDIRECT in the last 72 hours. Thyroid  Function Tests: No results for input(s): TSH, T4TOTAL, FREET4, T3FREE, THYROIDAB in the last 72 hours. Anemia Panel: No results for input(s): VITAMINB12, FOLATE, FERRITIN, TIBC, IRON, RETICCTPCT in the last 72 hours. Sepsis Labs: No results for input(s): PROCALCITON, LATICACIDVEN in the last 168 hours.  Recent Results (from the past 240 hours)  Resp panel by RT-PCR (RSV, Flu A&B, Covid) Anterior Nasal Swab     Status: Abnormal   Collection Time: 11/18/24  6:50 PM   Specimen: Anterior Nasal Swab  Result Value Ref Range Status   SARS Coronavirus 2 by RT PCR NEGATIVE NEGATIVE Final    Comment: (NOTE) SARS-CoV-2 target nucleic acids are NOT DETECTED.  The SARS-CoV-2 RNA is generally detectable in upper respiratory specimens during the acute phase of infection. The lowest concentration of SARS-CoV-2 viral copies this assay can detect is 138 copies/mL. A negative result does not preclude SARS-Cov-2 infection and should not be used as the sole basis for treatment or other patient management decisions. A negative result may occur with  improper specimen collection/handling, submission of specimen other than nasopharyngeal swab, presence of viral mutation(s) within the areas targeted by this assay, and inadequate number of viral copies(<138 copies/mL). A negative result must be combined with clinical observations, patient history, and  epidemiological information. The expected result is Negative.  Fact Sheet for Patients:  bloggercourse.com  Fact Sheet for Healthcare Providers:  seriousbroker.it  This test is no t yet approved or cleared by the [place]  FDA and  has been authorized for detection and/or diagnosis of SARS-CoV-2 by FDA under an Emergency Use Authorization (EUA). This EUA will remain  in effect (meaning this test can be used) for the duration of the COVID-19 declaration under Section 564(b)(1) of the Act, 21 U.S.C.section 360bbb-3(b)(1), unless the authorization is terminated  or revoked sooner.       Influenza A by PCR POSITIVE (A) NEGATIVE Final   Influenza B by PCR NEGATIVE NEGATIVE Final    Comment: (NOTE) The Xpert Xpress SARS-CoV-2/FLU/RSV plus assay is intended as an aid in the diagnosis of influenza from Nasopharyngeal swab specimens and should not be used as a sole basis for treatment. Nasal washings and aspirates are unacceptable for Xpert Xpress SARS-CoV-2/FLU/RSV testing.  Fact Sheet for Patients: bloggercourse.com  Fact Sheet for Healthcare Providers: seriousbroker.it  This  test is not yet approved or cleared by the [place]  FDA and has been authorized for detection and/or diagnosis of SARS-CoV-2 by FDA under an Emergency Use Authorization (EUA). This EUA will remain in effect (meaning this test can be used) for the duration of the COVID-19 declaration under Section 564(b)(1) of the Act, 21 U.S.C. section 360bbb-3(b)(1), unless the authorization is terminated or revoked.     Resp Syncytial Virus by PCR NEGATIVE NEGATIVE Final    Comment: (NOTE) Fact Sheet for Patients: bloggercourse.com  Fact Sheet for Healthcare Providers: seriousbroker.it  This test is not yet approved or cleared by the [place]  FDA and has  been authorized for detection and/or diagnosis of SARS-CoV-2 by FDA under an Emergency Use Authorization (EUA). This EUA will remain in effect (meaning this test can be used) for the duration of the COVID-19 declaration under Section 564(b)(1) of the Act, 21 U.S.C. section 360bbb-3(b)(1), unless the authorization is terminated or revoked.  Performed at [facility], [address] [place], [place] [address]   Respiratory (~20 pathogens) panel by PCR     Status: Abnormal   Collection Time: 11/18/24  6:50 PM   Specimen: Anterior Nasal Swab; Respiratory  Result Value Ref Range Status   Adenovirus NOT DETECTED NOT DETECTED Final   Coronavirus 229E NOT DETECTED NOT DETECTED Final    Comment: (NOTE) The Coronavirus on the Respiratory Panel, DOES NOT test for the novel  Coronavirus (2019 nCoV)    Coronavirus HKU1 NOT DETECTED NOT DETECTED Final   Coronavirus NL63 NOT DETECTED NOT DETECTED Final   Coronavirus OC43 NOT DETECTED NOT DETECTED Final   Metapneumovirus NOT DETECTED NOT DETECTED Final   Rhinovirus / Enterovirus NOT DETECTED NOT DETECTED Final   Influenza A H3 DETECTED (A) NOT DETECTED Final   Influenza B NOT DETECTED NOT DETECTED Final   Parainfluenza Virus 1 NOT DETECTED NOT DETECTED Final   Parainfluenza Virus 2 NOT DETECTED NOT DETECTED Final   Parainfluenza Virus 3 NOT DETECTED NOT DETECTED Final   Parainfluenza Virus 4 NOT DETECTED NOT DETECTED Final   Respiratory Syncytial Virus NOT DETECTED NOT DETECTED Final   Bordetella pertussis NOT DETECTED NOT DETECTED Final   Bordetella Parapertussis NOT DETECTED NOT DETECTED Final   Chlamydophila pneumoniae NOT DETECTED NOT DETECTED Final   Mycoplasma pneumoniae NOT DETECTED NOT DETECTED Final    Comment: Performed at [facility], [address], [place], [place] [address]         Radiology Studies: DG Chest Port 1 View Result Date: 11/18/2024 CLINICAL DATA:  Shortness of breath.  Productive  cough. EXAM: PORTABLE CHEST 1 VIEW COMPARISON:  12/12/2007 FINDINGS: The cardiomediastinal contours are normal. The lungs are clear. Pulmonary vasculature is normal. No consolidation, pleural effusion, or pneumothorax. No acute osseous abnormalities are seen. IMPRESSION: No active disease. Electronically Signed   By: Andrea Gasman M.D.   On: 11/18/2024 14:57        Scheduled Meds:  Chlorhexidine  Gluconate Cloth  6 each Topical Daily   enoxaparin  (LOVENOX ) injection  40 mg Subcutaneous Q24H    morphine  injection  4 mg Intravenous Once   oseltamivir   75 mg Oral BID   Continuous Infusions:  dextrose  5% lactated ringers  125 mL/hr at 11/19/24 0200   insulin  1.2 Units/hr (11/19/24 0813)   lactated ringers  Stopped (11/18/24 1917)   potassium chloride        LOS: 1 day    Time spent: 35 Minutes    Jaxon Flatt A Henchy Mccauley, MD  [facility]   If 7PM-7AM, please contact night-coverage [email or web address]  11/19/2024, 8:47 AM   "

## 2024-11-19 NOTE — ED Notes (Signed)
 Pt to CT

## 2024-11-19 NOTE — ED Notes (Addendum)
 Blondie, NP notified of potassium of 3.5 w/ pt being on insulin  drip. No new orders.

## 2024-11-20 DIAGNOSIS — E1010 Type 1 diabetes mellitus with ketoacidosis without coma: Secondary | ICD-10-CM

## 2024-11-20 LAB — BASIC METABOLIC PANEL WITH GFR
Anion gap: 12 (ref 5–15)
Anion gap: 10 (ref 5–15)
Anion gap: 10 (ref 5–15)
Anion gap: 10 (ref 5–15)
BUN: <5 mg/dL — ABNORMAL LOW (ref 6–20)
BUN: <5 mg/dL — ABNORMAL LOW (ref 6–20)
BUN: <5 mg/dL — ABNORMAL LOW (ref 6–20)
BUN: <5 mg/dL — ABNORMAL LOW (ref 6–20)
CO2: 18 mmol/L — ABNORMAL LOW (ref 22–32)
CO2: 19 mmol/L — ABNORMAL LOW (ref 22–32)
CO2: 20 mmol/L — ABNORMAL LOW (ref 22–32)
CO2: 20 mmol/L — ABNORMAL LOW (ref 22–32)
Calcium: 7.7 mg/dL — ABNORMAL LOW (ref 8.9–10.3)
Calcium: 7.8 mg/dL — ABNORMAL LOW (ref 8.9–10.3)
Calcium: 7.6 mg/dL — ABNORMAL LOW (ref 8.9–10.3)
Calcium: 7.6 mg/dL — ABNORMAL LOW (ref 8.9–10.3)
Chloride: 111 mmol/L (ref 98–111)
Chloride: 112 mmol/L — ABNORMAL HIGH (ref 98–111)
Chloride: 111 mmol/L (ref 98–111)
Chloride: 108 mmol/L (ref 98–111)
Creatinine, Ser: 0.54 mg/dL (ref 0.44–1.00)
Creatinine, Ser: 0.48 mg/dL (ref 0.44–1.00)
Creatinine, Ser: 0.44 mg/dL (ref 0.44–1.00)
Creatinine, Ser: 0.48 mg/dL (ref 0.44–1.00)
GFR, Estimated: >60 mL/min
GFR, Estimated: >60 mL/min
GFR, Estimated: >60 mL/min
GFR, Estimated: >60 mL/min
Glucose, Bld: 169 mg/dL — ABNORMAL HIGH (ref 70–99)
Glucose, Bld: 165 mg/dL — ABNORMAL HIGH (ref 70–99)
Glucose, Bld: 182 mg/dL — ABNORMAL HIGH (ref 70–99)
Glucose, Bld: 209 mg/dL — ABNORMAL HIGH (ref 70–99)
Potassium: 3 mmol/L — ABNORMAL LOW (ref 3.5–5.1)
Potassium: 2.9 mmol/L — ABNORMAL LOW (ref 3.5–5.1)
Potassium: 2.9 mmol/L — ABNORMAL LOW (ref 3.5–5.1)
Potassium: 3.2 mmol/L — ABNORMAL LOW (ref 3.5–5.1)
Sodium: 140 mmol/L (ref 135–145)
Sodium: 142 mmol/L (ref 135–145)
Sodium: 140 mmol/L (ref 135–145)
Sodium: 138 mmol/L (ref 135–145)

## 2024-11-20 LAB — CBC
HCT: 30.6 % — ABNORMAL LOW (ref 36.0–46.0)
HCT: 32.1 % — ABNORMAL LOW (ref 36.0–46.0)
Hemoglobin: 10.7 g/dL — ABNORMAL LOW (ref 12.0–15.0)
Hemoglobin: 10.9 g/dL — ABNORMAL LOW (ref 12.0–15.0)
MCH: 27.6 pg (ref 26.0–34.0)
MCH: 26.7 pg (ref 26.0–34.0)
MCHC: 27.6 g/dL — ABNORMAL LOW (ref 30.0–36.0)
MCHC: 34 g/dL (ref 30.0–36.0)
MCV: 78.9 fL — ABNORMAL LOW (ref 80.0–100.0)
MCV: 78.7 fL — ABNORMAL LOW (ref 80.0–100.0)
Platelets: 165 K/uL (ref 150–400)
Platelets: 166 K/uL (ref 150–400)
RBC: 3.88 MIL/uL (ref 3.87–5.11)
RBC: 4.08 MIL/uL (ref 3.87–5.11)
RDW: 13.4 % (ref 11.5–15.5)
RDW: 13.3 % (ref 11.5–15.5)
WBC: 6.5 K/uL (ref 4.0–10.5)
WBC: 4.7 K/uL (ref 4.0–10.5)
nRBC: 0 % (ref 0.0–0.2)
nRBC: 0 % (ref 0.0–0.2)

## 2024-11-20 LAB — GLUCOSE, CAPILLARY
Glucose-Capillary: 146 mg/dL — ABNORMAL HIGH (ref 70–99)
Glucose-Capillary: 146 mg/dL — ABNORMAL HIGH (ref 70–99)
Glucose-Capillary: 146 mg/dL — ABNORMAL HIGH (ref 70–99)
Glucose-Capillary: 153 mg/dL — ABNORMAL HIGH (ref 70–99)
Glucose-Capillary: 155 mg/dL — ABNORMAL HIGH (ref 70–99)
Glucose-Capillary: 156 mg/dL — ABNORMAL HIGH (ref 70–99)
Glucose-Capillary: 171 mg/dL — ABNORMAL HIGH (ref 70–99)
Glucose-Capillary: 175 mg/dL — ABNORMAL HIGH (ref 70–99)
Glucose-Capillary: 177 mg/dL — ABNORMAL HIGH (ref 70–99)
Glucose-Capillary: 186 mg/dL — ABNORMAL HIGH (ref 70–99)
Glucose-Capillary: 197 mg/dL — ABNORMAL HIGH (ref 70–99)
Glucose-Capillary: 216 mg/dL — ABNORMAL HIGH (ref 70–99)
Glucose-Capillary: 252 mg/dL — ABNORMAL HIGH (ref 70–99)

## 2024-11-20 LAB — MAGNESIUM: Magnesium: 1.7 mg/dL (ref 1.7–2.4)

## 2024-11-20 LAB — BETA-HYDROXYBUTYRIC ACID
Beta-Hydroxybutyric Acid: 1.32 mmol/L — ABNORMAL HIGH (ref 0.05–0.27)
Beta-Hydroxybutyric Acid: 0.54 mmol/L — ABNORMAL HIGH (ref 0.05–0.27)

## 2024-11-20 MED ORDER — INSULIN ASPART 100 UNIT/ML IJ SOLN
0.0000 [IU] | Freq: Three times a day (TID) | INTRAMUSCULAR | Status: DC
  Administered 2024-11-20: 8 [IU] via SUBCUTANEOUS
  Administered 2024-11-20 – 2024-11-21 (×3): 3 [IU] via SUBCUTANEOUS
  Filled 2024-11-20 (×3): qty 3
  Filled 2024-11-20: qty 8

## 2024-11-20 MED ORDER — DOXYCYCLINE HYCLATE 100 MG PO TABS
100.0000 mg | ORAL_TABLET | Freq: Two times a day (BID) | ORAL | Status: DC
  Administered 2024-11-20 – 2024-11-21 (×3): 100 mg via ORAL
  Filled 2024-11-20 (×3): qty 1

## 2024-11-20 MED ORDER — AMOXICILLIN-POT CLAVULANATE 875-125 MG PO TABS
1.0000 | ORAL_TABLET | Freq: Two times a day (BID) | ORAL | Status: DC
  Administered 2024-11-20 – 2024-11-21 (×3): 1 via ORAL
  Filled 2024-11-20 (×3): qty 1

## 2024-11-20 MED ORDER — POTASSIUM CHLORIDE CRYS ER 20 MEQ PO TBCR
40.0000 meq | EXTENDED_RELEASE_TABLET | Freq: Once | ORAL | Status: AC
  Administered 2024-11-20: 40 meq via ORAL
  Filled 2024-11-20: qty 2

## 2024-11-20 MED ORDER — INSULIN ASPART 100 UNIT/ML IJ SOLN
4.0000 [IU] | Freq: Three times a day (TID) | INTRAMUSCULAR | Status: DC
  Administered 2024-11-20 – 2024-11-21 (×4): 4 [IU] via SUBCUTANEOUS
  Filled 2024-11-20 (×4): qty 4

## 2024-11-20 MED ORDER — INSULIN GLARGINE-YFGN 100 UNIT/ML ~~LOC~~ SOLN
20.0000 [IU] | Freq: Two times a day (BID) | SUBCUTANEOUS | Status: DC
  Administered 2024-11-20 – 2024-11-21 (×2): 20 [IU] via SUBCUTANEOUS
  Filled 2024-11-20 (×3): qty 0.2

## 2024-11-20 MED ORDER — POTASSIUM CHLORIDE CRYS ER 20 MEQ PO TBCR
40.0000 meq | EXTENDED_RELEASE_TABLET | Freq: Two times a day (BID) | ORAL | Status: AC
  Administered 2024-11-20 (×2): 40 meq via ORAL
  Filled 2024-11-20 (×2): qty 2

## 2024-11-20 MED ORDER — POTASSIUM CHLORIDE 20 MEQ PO PACK
40.0000 meq | PACK | Freq: Two times a day (BID) | ORAL | Status: DC
  Administered 2024-11-20: 40 meq via ORAL
  Filled 2024-11-20: qty 2

## 2024-11-20 MED ORDER — MAGNESIUM SULFATE 2 GM/50ML IV SOLN
2.0000 g | Freq: Once | INTRAVENOUS | Status: AC
  Administered 2024-11-20: 2 g via INTRAVENOUS
  Filled 2024-11-20: qty 50

## 2024-11-20 MED ORDER — SODIUM CHLORIDE 0.45 % IV SOLN: INTRAVENOUS | Status: AC

## 2024-11-20 MED ORDER — ALBUTEROL SULFATE (2.5 MG/3ML) 0.083% IN NEBU: 2.5000 mg | INHALATION_SOLUTION | RESPIRATORY_TRACT | Status: DC | PRN

## 2024-11-20 MED ORDER — INSULIN ASPART 100 UNIT/ML IJ SOLN
0.0000 [IU] | Freq: Every day | INTRAMUSCULAR | Status: DC
  Administered 2024-11-20: 2 [IU] via SUBCUTANEOUS
  Filled 2024-11-20: qty 2

## 2024-11-20 MED ORDER — INSULIN GLARGINE-YFGN 100 UNIT/ML ~~LOC~~ SOLN
10.0000 [IU] | Freq: Two times a day (BID) | SUBCUTANEOUS | Status: DC
  Administered 2024-11-20: 10 [IU] via SUBCUTANEOUS
  Filled 2024-11-20 (×2): qty 0.1

## 2024-11-20 NOTE — Progress Notes (Signed)
 [facility] PROGRESS NOTE    Progress Note  Kristina Sparks  FMW:[id number] DOB: 12-Dec-2000 DOA: 11/18/2024 PCP: Pcp, No     Brief Narrative:   Kristina Sparks is an 23 y.o. female past medical history of diabetes mellitus type 1 noncompliant with her medication, she will she has not taken her medication since April 2025 comes into the ED with cough congestion that started on Christmas Eve found to be in DKA and influenza A positive   Assessment/Plan:   DKA (diabetic ketoacidosis) (HCC) Bicarb was less than 7 and anion gap was greater than 16 on admission. Started on IV insulin  and IV fluids. In This morning's time with a bicarb of 19. Will continue IV insulin  until bicarb greater than 20.  Influenza A infection: Continue Tamiflu  for 5 days.  She has been weaned to room air. He had a white count of 17,000 on admission so there was a concern for imposed infection. Started on Rocephin  and azithromycin . Has defervesced leukocytosis improved. Will de-escalate to Augmentin  and Doxy.  Hypokalemia: Replete orally recheck in the morning. Try to keep potassium greater than 4, magnesium  greater than 2.  Pleuritic chest pain: CT angio negative for PE.   DVT prophylaxis: lovenox  Family Communication:none Status is: Inpatient Remains inpatient appropriate because: DKA    Code Status:     Code Status Orders  (From admission, onward)           Start     Ordered   11/18/24 1737  Full code  Continuous       Question:  By:  Answer:  Consent: discussion documented in EHR   11/18/24 1737           Code Status History     Date Active Date Inactive Code Status Order ID Comments User Context   08/17/2022 1944 08/18/2022 1820 Full Code [id number]  Tory Velia FALCON, NP Inpatient   08/17/2022 1944 08/17/2022 1944 Full Code [id number]  Tory Velia FALCON, NP Inpatient   08/16/2022 2222 08/17/2022 1943 Full Code [id number]  Angelena Smalls, MD ED   08/31/2021 1318  09/01/2021 2153 Full Code [id number]  Celinda Alm Lot, MD ED   06/16/2017 1227 06/17/2017 1447 Full Code [id number]  Seena Samuella DEL, MD Inpatient         IV Access:   Peripheral IV   Procedures and diagnostic studies:   CT Angio Chest Pulmonary Embolism (PE) W or WO Contrast Result Date: 11/19/2024 CLINICAL DATA:  Shortness of breath and chest discomfort EXAM: CT ANGIOGRAPHY CHEST WITH CONTRAST TECHNIQUE: Multidetector CT imaging of the chest was performed using the standard protocol during bolus administration of intravenous contrast. Multiplanar CT image reconstructions and MIPs were obtained to evaluate the vascular anatomy. RADIATION DOSE REDUCTION: This exam was performed according to the departmental dose-optimization program which includes automated exposure control, adjustment of the mA and/or kV according to patient size and/or use of iterative reconstruction technique. CONTRAST:  75mL OMNIPAQUE  IOHEXOL  350 MG/ML SOLN COMPARISON:  None Available. FINDINGS: Cardiovascular: Satisfactory opacification of the pulmonary arteries to the segmental level. No evidence of pulmonary embolism. Normal heart size. No pericardial effusion. Mediastinum/Nodes: No enlarged mediastinal, hilar, or axillary lymph nodes. Thyroid  gland, trachea, and esophagus demonstrate no significant findings. Lungs/Pleura: Lungs are clear. No pleural effusion or pneumothorax. Upper Abdomen: No acute abnormality. Musculoskeletal: No chest wall abnormality. No acute or significant osseous findings. Review of the MIP images confirms the above findings. IMPRESSION: No definite evidence of pulmonary embolus. No acute abnormality  seen in the chest. Electronically Signed   By: Lynwood Landy Raddle M.D.   On: 11/19/2024 12:18   DG Chest Port 1 View Result Date: 11/18/2024 CLINICAL DATA:  Shortness of breath.  Productive cough. EXAM: PORTABLE CHEST 1 VIEW COMPARISON:  12/12/2007 FINDINGS: The cardiomediastinal contours are normal. The  lungs are clear. Pulmonary vasculature is normal. No consolidation, pleural effusion, or pneumothorax. No acute osseous abnormalities are seen. IMPRESSION: No active disease. Electronically Signed   By: Andrea Gasman M.D.   On: 11/18/2024 14:57     Medical Consultants:   None.   Subjective:    Kristina Sparks relates she feels about the same.  Objective:    Vitals:   11/20/24 0500 11/20/24 0600 11/20/24 0700 11/20/24 0800  BP: 115/70 113/65 103/71 119/68  Pulse: (!) 102 90 91 91  Resp: (!) 24 12 14 14   Temp:      TempSrc:      SpO2: 98% 99% 99% 99%  Weight:      Height:       SpO2: 99 % O2 Flow Rate (L/min): 2 L/min   Intake/Output Summary (Last 24 hours) at 11/20/2024 0819 Last data filed at 11/20/2024 0800 Gross per 24 hour  Intake 4601.63 ml  Output --  Net 4601.63 ml   Filed Weights   11/18/24 1407  Weight: 68 kg    Exam: General exam: In no acute distress. Respiratory system: Good air movement and clear to auscultation. Cardiovascular system: S1 & S2 heard, RRR. No JVD. Gastrointestinal system: Abdomen is nondistended, soft and nontender.  Extremities: No pedal edema. Skin: No rashes, lesions or ulcers Psychiatry: Judgement and insight appear normal.  Patient not on good mood this morning with an very pleasant attitude   Data Reviewed:    Labs: Basic Metabolic Panel: Recent Labs  Lab 11/18/24 2304 11/19/24 0203 11/19/24 1248 11/19/24 1620 11/19/24 2053 11/20/24 0030 11/20/24 0435  NA 138   < > 140 139 140 140 142  K 3.9   < > 4.0 3.1* 3.0* 3.0* 2.9*  CL 111   < > 109 110 112* 111 112*  CO2 9*   < > 12* 15* 17* 18* 19*  GLUCOSE 195*   < > 219* 243* 187* 169* 165*  BUN 11   < > 8 6 5* <5* <5*  CREATININE 0.86   < > 0.68 0.64 0.51 0.54 0.48  CALCIUM 8.2*   < > 8.3* 8.0* 7.7* 7.7* 7.8*  MG 1.8  --   --   --   --  1.7  --    < > = values in this interval not displayed.   GFR Estimated Creatinine Clearance: 107.3 mL/min (by C-G  formula based on SCr of 0.48 mg/dL). Liver Function Tests: No results for input(s): AST, ALT, ALKPHOS, BILITOT, PROT, ALBUMIN in the last 168 hours. No results for input(s): LIPASE, AMYLASE in the last 168 hours. No results for input(s): AMMONIA in the last 168 hours. Coagulation profile No results for input(s): INR, PROTIME in the last 168 hours. COVID-19 Labs  No results for input(s): DDIMER, FERRITIN, LDH, CRP in the last 72 hours.  Lab Results  Component Value Date   SARSCOV2NAA NEGATIVE 11/18/2024   SARSCOV2NAA NEGATIVE 08/17/2022   SARSCOV2NAA NEGATIVE 08/31/2021   SARSCOV2NAA NEGATIVE 08/27/2020    CBC: Recent Labs  Lab 11/18/24 1538 11/18/24 1852 11/20/24 0435  WBC 16.0* 17.0* 6.5  NEUTROABS 12.9*  --   --   HGB 15.5*  14.8 10.7*  HCT 50.6* 48.7* 30.6*  MCV 87.5 87.3 78.9*  PLT 287 251 165   Cardiac Enzymes: No results for input(s): CKTOTAL, CKMB, CKMBINDEX, TROPONINI in the last 168 hours. BNP (last 3 results) No results for input(s): PROBNP in the last 8760 hours. CBG: Recent Labs  Lab 11/20/24 0259 11/20/24 0405 11/20/24 0529 11/20/24 0632 11/20/24 0736  GLUCAP 177* 156* 153* 146* 146*   D-Dimer: No results for input(s): DDIMER in the last 72 hours. Hgb A1c: Recent Labs    11/18/24 1850  HGBA1C 16.2*   Lipid Profile: No results for input(s): CHOL, HDL, LDLCALC, TRIG, CHOLHDL, LDLDIRECT in the last 72 hours. Thyroid  function studies: No results for input(s): TSH, T4TOTAL, T3FREE, THYROIDAB in the last 72 hours.  Invalid input(s): FREET3 Anemia work up: No results for input(s): VITAMINB12, FOLATE, FERRITIN, TIBC, IRON, RETICCTPCT in the last 72 hours. Sepsis Labs: Recent Labs  Lab 11/18/24 1538 11/18/24 1852 11/20/24 0435  WBC 16.0* 17.0* 6.5   Microbiology Recent Results (from the past 240 hours)  Resp panel by RT-PCR (RSV, Flu A&B, Covid) Anterior Nasal Swab      Status: Abnormal   Collection Time: 11/18/24  6:50 PM   Specimen: Anterior Nasal Swab  Result Value Ref Range Status   SARS Coronavirus 2 by RT PCR NEGATIVE NEGATIVE Final    Comment: (NOTE) SARS-CoV-2 target nucleic acids are NOT DETECTED.  The SARS-CoV-2 RNA is generally detectable in upper respiratory specimens during the acute phase of infection. The lowest concentration of SARS-CoV-2 viral copies this assay can detect is 138 copies/mL. A negative result does not preclude SARS-Cov-2 infection and should not be used as the sole basis for treatment or other patient management decisions. A negative result may occur with  improper specimen collection/handling, submission of specimen other than nasopharyngeal swab, presence of viral mutation(s) within the areas targeted by this assay, and inadequate number of viral copies(<138 copies/mL). A negative result must be combined with clinical observations, patient history, and epidemiological information. The expected result is Negative.  Fact Sheet for Patients:  bloggercourse.com  Fact Sheet for Healthcare Providers:  seriousbroker.it  This test is no t yet approved or cleared by the [place]  FDA and  has been authorized for detection and/or diagnosis of SARS-CoV-2 by FDA under an Emergency Use Authorization (EUA). This EUA will remain  in effect (meaning this test can be used) for the duration of the COVID-19 declaration under Section 564(b)(1) of the Act, 21 U.S.C.section 360bbb-3(b)(1), unless the authorization is terminated  or revoked sooner.       Influenza A by PCR POSITIVE (A) NEGATIVE Final   Influenza B by PCR NEGATIVE NEGATIVE Final    Comment: (NOTE) The Xpert Xpress SARS-CoV-2/FLU/RSV plus assay is intended as an aid in the diagnosis of influenza from Nasopharyngeal swab specimens and should not be used as a sole basis for treatment. Nasal washings and aspirates  are unacceptable for Xpert Xpress SARS-CoV-2/FLU/RSV testing.  Fact Sheet for Patients: bloggercourse.com  Fact Sheet for Healthcare Providers: seriousbroker.it  This test is not yet approved or cleared by the [place]  FDA and has been authorized for detection and/or diagnosis of SARS-CoV-2 by FDA under an Emergency Use Authorization (EUA). This EUA will remain in effect (meaning this test can be used) for the duration of the COVID-19 declaration under Section 564(b)(1) of the Act, 21 U.S.C. section 360bbb-3(b)(1), unless the authorization is terminated or revoked.     Resp Syncytial Virus by PCR NEGATIVE NEGATIVE  Final    Comment: (NOTE) Fact Sheet for Patients: bloggercourse.com  Fact Sheet for Healthcare Providers: seriousbroker.it  This test is not yet approved or cleared by the [place]  FDA and has been authorized for detection and/or diagnosis of SARS-CoV-2 by FDA under an Emergency Use Authorization (EUA). This EUA will remain in effect (meaning this test can be used) for the duration of the COVID-19 declaration under Section 564(b)(1) of the Act, 21 U.S.C. section 360bbb-3(b)(1), unless the authorization is terminated or revoked.  Performed at [facility], [address], [place], [place] [address]   Respiratory (~20 pathogens) panel by PCR     Status: Abnormal   Collection Time: 11/18/24  6:50 PM   Specimen: Anterior Nasal Swab; Respiratory  Result Value Ref Range Status   Adenovirus NOT DETECTED NOT DETECTED Final   Coronavirus 229E NOT DETECTED NOT DETECTED Final    Comment: (NOTE) The Coronavirus on the Respiratory Panel, DOES NOT test for the novel  Coronavirus (2019 nCoV)    Coronavirus HKU1 NOT DETECTED NOT DETECTED Final   Coronavirus NL63 NOT DETECTED NOT DETECTED Final   Coronavirus OC43 NOT DETECTED NOT DETECTED Final    Metapneumovirus NOT DETECTED NOT DETECTED Final   Rhinovirus / Enterovirus NOT DETECTED NOT DETECTED Final   Influenza A H3 DETECTED (A) NOT DETECTED Final   Influenza B NOT DETECTED NOT DETECTED Final   Parainfluenza Virus 1 NOT DETECTED NOT DETECTED Final   Parainfluenza Virus 2 NOT DETECTED NOT DETECTED Final   Parainfluenza Virus 3 NOT DETECTED NOT DETECTED Final   Parainfluenza Virus 4 NOT DETECTED NOT DETECTED Final   Respiratory Syncytial Virus NOT DETECTED NOT DETECTED Final   Bordetella pertussis NOT DETECTED NOT DETECTED Final   Bordetella Parapertussis NOT DETECTED NOT DETECTED Final   Chlamydophila pneumoniae NOT DETECTED NOT DETECTED Final   Mycoplasma pneumoniae NOT DETECTED NOT DETECTED Final    Comment: Performed at [facility], [address], [place], [place] [address]  MRSA Next Gen by PCR, Nasal     Status: None   Collection Time: 11/19/24  7:01 PM   Specimen: Nasal Mucosa; Nasal Swab  Result Value Ref Range Status   MRSA by PCR Next Gen NOT DETECTED NOT DETECTED Final    Comment: (NOTE) The GeneXpert MRSA Assay (FDA approved for NASAL specimens only), is one component of a comprehensive MRSA colonization surveillance program. It is not intended to diagnose MRSA infection nor to guide or monitor treatment for MRSA infections. Test performance is not FDA approved in patients less than 54 years old. Performed at [facility], [address], [place], [place] [address]      Medications:    azithromycin   500 mg Oral Daily   Chlorhexidine  Gluconate Cloth  6 each Topical Daily   enoxaparin  (LOVENOX ) injection  40 mg Subcutaneous Q24H   guaiFENesin   1,200 mg Oral BID   ipratropium-albuterol   3 mL Nebulization BID    morphine  injection  4 mg Intravenous Once   oseltamivir   75 mg Oral BID   pantoprazole  (PROTONIX ) IV  40 mg Intravenous Q12H   Continuous Infusions:  cefTRIAXone  (ROCEPHIN )  IV Stopped (11/19/24 1637)   dextrose  5%  lactated ringers  125 mL/hr at 11/20/24 0800   insulin  1.1 Units/hr (11/20/24 0800)      LOS: 2 days   Erle Odell Castor  [facility]  11/20/2024, 8:19 AM

## 2024-11-20 NOTE — TOC Initial Note (Signed)
 Transition of Care [facility]) - Initial/Assessment Note    Patient Details  Name: Kristina Sparks MRN: [id number] Date of Birth: 2001-09-19  [facility]) CM/SW Contact:    Jon ONEIDA Anon, RN Phone Number: 11/20/2024, 3:39 PM  Clinical Narrative:                 Pt is from home. Pt needing continued medical workup, not medically ready for discharge at this time. ICM consulted for PCP/ medication assistance. Pt with new PCP appt scheduled for 12/05/2024 at 1:30p with Lauraine Angus, NP. Medication assistance is unavailable at this time, as pt has insurance through [employer]. ICM will follow for any additional DC planning needs.     Expected Discharge Plan: Home/Self Care Barriers to Discharge: Continued Medical Work up   Patient Goals and CMS Choice Patient states their goals for this hospitalization and ongoing recovery are:: Home CMS Medicare.gov Compare Post Acute Care list provided to:: Other (Comment Required) (NA) Choice offered to / list presented to : NA [place] ownership interest in [facility].provided to:: Parent NA    Expected Discharge Plan and Services In-house Referral: NA Discharge Planning Services: CM Consult Post Acute Care Choice: NA                                        Prior Living Arrangements/Services   Lives with:: Self Patient language and need for interpreter reviewed:: Yes Do you feel safe going back to the place where you live?: Yes      Need for Family Participation in Patient Care: Yes (Comment) Care giver support system in place?: Yes (comment) Current home services: Other (comment) (None) Criminal Activity/Legal Involvement Pertinent to Current Situation/Hospitalization: No - Comment as needed  Activities of Daily Living   ADL Screening (condition at time of admission) Independently performs ADLs?: Yes (appropriate for developmental age) Is the patient deaf or have difficulty hearing?: No Does the patient  have difficulty seeing, even when wearing glasses/contacts?: No Does the patient have difficulty concentrating, remembering, or making decisions?: No  Permission Sought/Granted Permission sought to share information with : Family Supports    Share Information with NAME: Butler Gram  Mother, Emergency Contact  [phone]           Emotional Assessment Appearance:: Other (Comment Required (UTA) Attitude/Demeanor/Rapport: Unable to Assess Affect (typically observed): Unable to Assess   Alcohol / Substance Use: Not Applicable Psych Involvement: No (comment)  Admission diagnosis:  DKA (diabetic ketoacidosis) (HCC) [E11.10] Hyperglycemia [R73.9] Patient Active Problem List   Diagnosis Date Noted   DKA (diabetic ketoacidosis) (HCC) 11/18/2024   Adjustment disorder with mixed anxiety and depressed mood 08/18/2022   MDD (major depressive disorder) 08/17/2022   MDD (major depressive disorder), recurrent episode, severe (HCC) 08/16/2022   Generalized anxiety disorder 08/16/2022   Type 1 diabetes mellitus with hyperglycemia (HCC) 07/25/2022   PCP:  Pcp, No Pharmacy:   [id number] - [place], [place] - [address] AT [facility] OF [address] [place] [address] Phone: [phone] Fax: [phone]  [id number] [place], [place] - [address] AT [facility] OF [address] & [facility] [address] [place] [address] Phone: [phone] Fax: [phone]  [facility] [place], [place] - [address] [place] [address] Phone: [phone] Fax: [phone]     Social Drivers of Health (SDOH) Social History: SDOH Screenings   Food Insecurity: No Food Insecurity (11/20/2024)  Housing: Low Risk (11/20/2024)  Transportation Needs: No Transportation Needs (11/20/2024)  Utilities: Not At Risk (11/20/2024)  Alcohol Screen: Low Risk (08/17/2022)   Depression (PHQ2-9): High Risk (07/17/2024)  Tobacco Use: Low Risk (11/18/2024)   SDOH Interventions:     Readmission Risk Interventions    11/20/2024    3:28 PM  Readmission Risk Prevention Plan  Post Dischage Appt Complete  Medication Screening Complete  Transportation Screening Complete

## 2024-11-21 DIAGNOSIS — E1010 Type 1 diabetes mellitus with ketoacidosis without coma: Secondary | ICD-10-CM | POA: Diagnosis not present

## 2024-11-21 LAB — MAGNESIUM: Magnesium: 1.9 mg/dL (ref 1.7–2.4)

## 2024-11-21 LAB — GLUCOSE, CAPILLARY
Glucose-Capillary: 177 mg/dL — ABNORMAL HIGH (ref 70–99)
Glucose-Capillary: 197 mg/dL — ABNORMAL HIGH (ref 70–99)

## 2024-11-21 MED ORDER — AMOXICILLIN-POT CLAVULANATE 875-125 MG PO TABS: 1.0000 | ORAL_TABLET | Freq: Two times a day (BID) | ORAL | 0 refills | Status: AC

## 2024-11-21 MED ORDER — OSELTAMIVIR PHOSPHATE 75 MG PO CAPS: 75.0000 mg | ORAL_CAPSULE | Freq: Two times a day (BID) | ORAL | 0 refills | Status: AC

## 2024-11-21 MED ORDER — INSULIN ASPART PROT & ASPART (70-30 MIX) 100 UNIT/ML PEN: 16.0000 [IU] | PEN_INJECTOR | Freq: Two times a day (BID) | SUBCUTANEOUS | 0 refills | Status: DC

## 2024-11-21 MED ORDER — INSULIN PEN NEEDLE 31G X 6 MM MISC: 1.0000 | Freq: Two times a day (BID) | 3 refills | Status: AC

## 2024-11-21 MED ORDER — DOXYCYCLINE HYCLATE 100 MG PO TABS: 100.0000 mg | ORAL_TABLET | Freq: Two times a day (BID) | ORAL | 0 refills | Status: AC

## 2024-11-21 NOTE — Discharge Summary (Signed)
 Physician Discharge Summary  Kristina Sparks FMW:[id number] DOB: 2001/03/26 DOA: 11/18/2024  PCP: Freddrick, No  Admit date: 11/18/2024 Discharge date: 11/21/2024  Admitted From: Home Disposition:  Home  Recommendations for Outpatient Follow-up:  Follow up with PCP in 1-2 weeks Please obtain BMP/CBC in one week Home Health:No Equipment/Devices:None  Discharge Condition:Stable CODE STATUS:Full Diet recommendation: Heart Healthy  Brief/Interim Summary: 24 y.o. female past medical history of diabetes mellitus type 1 noncompliant with her medication, she will she has not taken her medication since April 2025 comes into the ED with cough congestion that started on Christmas Eve found to be in DKA and influenza A positive   Discharge Diagnoses:  Principal Problem:   DKA (diabetic ketoacidosis) (HCC) Bicarb was less than 7 anion gap greater than 16 on admission. She was started on IV insulin  and IV fluids. This DKA episode was likely due to noncompliance. This resolved within 24 hours she was changed back to her 7030 which will continue as an outpatient. She was given prescription for insulin  and needles.  Influenza A infection: She was continue on Tamiflu  we were able to wean her to room air. She had a white count of 17,000 on admission concern about superimposed infection. She was started on IV Rocephin  and azithromycin  she defervesced transition to oral antibiotics which she will continue for 5 days as an outpatient.  Hypokalemia: Replete orally now improved.  Pleuritic chest pain: CT angio of the chest was negative for PE pain has resolved upon discharge.   Discharge Instructions  Discharge Instructions     Increase activity slowly   Complete by: As directed       Allergies as of 11/21/2024   No Known Allergies      Medication List     STOP taking these medications    Boric Acid Crys   fluconazole  150 MG tablet Commonly known as: DIFLUCAN    nystatin   cream Commonly known as: MYCOSTATIN        TAKE these medications    amoxicillin -clavulanate 875-125 MG tablet Commonly known as: AUGMENTIN  Take 1 tablet by mouth every 12 (twelve) hours for 5 days.   doxycycline  100 MG tablet Commonly known as: VIBRA -TABS Take 1 tablet (100 mg total) by mouth every 12 (twelve) hours for 5 days.   insulin  aspart protamine - aspart (70-30) 100 UNIT/ML FlexPen Commonly known as: NOVOLOG  70/30 MIX Inject 16 Units into the skin 2 (two) times daily.   Insulin  Pen Needle 31G X 6 MM Misc 1 Device by Does not apply route 2 (two) times daily.   oseltamivir  75 MG capsule Commonly known as: TAMIFLU  Take 1 capsule (75 mg total) by mouth 2 (two) times daily for 2 days.        Allergies[1]  Consultations: None   Procedures/Studies: CT Angio Chest Pulmonary Embolism (PE) W or WO Contrast Result Date: 11/19/2024 CLINICAL DATA:  Shortness of breath and chest discomfort EXAM: CT ANGIOGRAPHY CHEST WITH CONTRAST TECHNIQUE: Multidetector CT imaging of the chest was performed using the standard protocol during bolus administration of intravenous contrast. Multiplanar CT image reconstructions and MIPs were obtained to evaluate the vascular anatomy. RADIATION DOSE REDUCTION: This exam was performed according to the departmental dose-optimization program which includes automated exposure control, adjustment of the mA and/or kV according to patient size and/or use of iterative reconstruction technique. CONTRAST:  75mL OMNIPAQUE  IOHEXOL  350 MG/ML SOLN COMPARISON:  None Available. FINDINGS: Cardiovascular: Satisfactory opacification of the pulmonary arteries to the segmental level. No evidence of pulmonary embolism.  Normal heart size. No pericardial effusion. Mediastinum/Nodes: No enlarged mediastinal, hilar, or axillary lymph nodes. Thyroid  gland, trachea, and esophagus demonstrate no significant findings. Lungs/Pleura: Lungs are clear. No pleural effusion or  pneumothorax. Upper Abdomen: No acute abnormality. Musculoskeletal: No chest wall abnormality. No acute or significant osseous findings. Review of the MIP images confirms the above findings. IMPRESSION: No definite evidence of pulmonary embolus. No acute abnormality seen in the chest. Electronically Signed   By: Lynwood Landy Raddle M.D.   On: 11/19/2024 12:18   DG Chest Port 1 View Result Date: 11/18/2024 CLINICAL DATA:  Shortness of breath.  Productive cough. EXAM: PORTABLE CHEST 1 VIEW COMPARISON:  12/12/2007 FINDINGS: The cardiomediastinal contours are normal. The lungs are clear. Pulmonary vasculature is normal. No consolidation, pleural effusion, or pneumothorax. No acute osseous abnormalities are seen. IMPRESSION: No active disease. Electronically Signed   By: Andrea Gasman M.D.   On: 11/18/2024 14:57   (Echo, Carotid, EGD, Colonoscopy, ERCP)    Subjective: No complaints  Discharge Exam: Vitals:   11/21/24 0618 11/21/24 1002  BP: 109/70   Pulse: 84   Resp: 15   Temp: 98.5 F (36.9 C)   SpO2: 99% 100%   Vitals:   11/20/24 2056 11/21/24 0136 11/21/24 0618 11/21/24 1002  BP: 108/66 118/76 109/70   Pulse: (!) 104 88 84   Resp: 18 18 15    Temp: 98.5 F (36.9 C) 98.6 F (37 C) 98.5 F (36.9 C)   TempSrc: Oral Oral Oral   SpO2: 99% 99% 99% 100%  Weight:      Height:        General: Pt is alert, awake, not in acute distress Cardiovascular: RRR, S1/S2 +, no rubs, no gallops Respiratory: CTA bilaterally, no wheezing, no rhonchi Abdominal: Soft, NT, ND, bowel sounds + Extremities: no edema, no cyanosis    The results of significant diagnostics from this hospitalization (including imaging, microbiology, ancillary and laboratory) are listed below for reference.     Microbiology: Recent Results (from the past 240 hours)  Resp panel by RT-PCR (RSV, Flu A&B, Covid) Anterior Nasal Swab     Status: Abnormal   Collection Time: 11/18/24  6:50 PM   Specimen: Anterior Nasal Swab   Result Value Ref Range Status   SARS Coronavirus 2 by RT PCR NEGATIVE NEGATIVE Final    Comment: (NOTE) SARS-CoV-2 target nucleic acids are NOT DETECTED.  The SARS-CoV-2 RNA is generally detectable in upper respiratory specimens during the acute phase of infection. The lowest concentration of SARS-CoV-2 viral copies this assay can detect is 138 copies/mL. A negative result does not preclude SARS-Cov-2 infection and should not be used as the sole basis for treatment or other patient management decisions. A negative result may occur with  improper specimen collection/handling, submission of specimen other than nasopharyngeal swab, presence of viral mutation(s) within the areas targeted by this assay, and inadequate number of viral copies(<138 copies/mL). A negative result must be combined with clinical observations, patient history, and epidemiological information. The expected result is Negative.  Fact Sheet for Patients:  bloggercourse.com  Fact Sheet for Healthcare Providers:  seriousbroker.it  This test is no t yet approved or cleared by the [place]  FDA and  has been authorized for detection and/or diagnosis of SARS-CoV-2 by FDA under an Emergency Use Authorization (EUA). This EUA will remain  in effect (meaning this test can be used) for the duration of the COVID-19 declaration under Section 564(b)(1) of the Act, 21 U.S.C.section 360bbb-3(b)(1), unless the  authorization is terminated  or revoked sooner.       Influenza A by PCR POSITIVE (A) NEGATIVE Final   Influenza B by PCR NEGATIVE NEGATIVE Final    Comment: (NOTE) The Xpert Xpress SARS-CoV-2/FLU/RSV plus assay is intended as an aid in the diagnosis of influenza from Nasopharyngeal swab specimens and should not be used as a sole basis for treatment. Nasal washings and aspirates are unacceptable for Xpert Xpress SARS-CoV-2/FLU/RSV testing.  Fact Sheet for  Patients: bloggercourse.com  Fact Sheet for Healthcare Providers: seriousbroker.it  This test is not yet approved or cleared by the [place]  FDA and has been authorized for detection and/or diagnosis of SARS-CoV-2 by FDA under an Emergency Use Authorization (EUA). This EUA will remain in effect (meaning this test can be used) for the duration of the COVID-19 declaration under Section 564(b)(1) of the Act, 21 U.S.C. section 360bbb-3(b)(1), unless the authorization is terminated or revoked.     Resp Syncytial Virus by PCR NEGATIVE NEGATIVE Final    Comment: (NOTE) Fact Sheet for Patients: bloggercourse.com  Fact Sheet for Healthcare Providers: seriousbroker.it  This test is not yet approved or cleared by the [place]  FDA and has been authorized for detection and/or diagnosis of SARS-CoV-2 by FDA under an Emergency Use Authorization (EUA). This EUA will remain in effect (meaning this test can be used) for the duration of the COVID-19 declaration under Section 564(b)(1) of the Act, 21 U.S.C. section 360bbb-3(b)(1), unless the authorization is terminated or revoked.  Performed at [facility], [address] [place], [place] [address]   Respiratory (~20 pathogens) panel by PCR     Status: Abnormal   Collection Time: 11/18/24  6:50 PM   Specimen: Anterior Nasal Swab; Respiratory  Result Value Ref Range Status   Adenovirus NOT DETECTED NOT DETECTED Final   Coronavirus 229E NOT DETECTED NOT DETECTED Final    Comment: (NOTE) The Coronavirus on the Respiratory Panel, DOES NOT test for the novel  Coronavirus (2019 nCoV)    Coronavirus HKU1 NOT DETECTED NOT DETECTED Final   Coronavirus NL63 NOT DETECTED NOT DETECTED Final   Coronavirus OC43 NOT DETECTED NOT DETECTED Final   Metapneumovirus NOT DETECTED NOT DETECTED Final   Rhinovirus / Enterovirus NOT  DETECTED NOT DETECTED Final   Influenza A H3 DETECTED (A) NOT DETECTED Final   Influenza B NOT DETECTED NOT DETECTED Final   Parainfluenza Virus 1 NOT DETECTED NOT DETECTED Final   Parainfluenza Virus 2 NOT DETECTED NOT DETECTED Final   Parainfluenza Virus 3 NOT DETECTED NOT DETECTED Final   Parainfluenza Virus 4 NOT DETECTED NOT DETECTED Final   Respiratory Syncytial Virus NOT DETECTED NOT DETECTED Final   Bordetella pertussis NOT DETECTED NOT DETECTED Final   Bordetella Parapertussis NOT DETECTED NOT DETECTED Final   Chlamydophila pneumoniae NOT DETECTED NOT DETECTED Final   Mycoplasma pneumoniae NOT DETECTED NOT DETECTED Final    Comment: Performed at [facility], [address] [place], [place] [address]  MRSA Next Gen by PCR, Nasal     Status: None   Collection Time: 11/19/24  7:01 PM   Specimen: Nasal Mucosa; Nasal Swab  Result Value Ref Range Status   MRSA by PCR Next Gen NOT DETECTED NOT DETECTED Final    Comment: (NOTE) The GeneXpert MRSA Assay (FDA approved for NASAL specimens only), is one component of a comprehensive MRSA colonization surveillance program. It is not intended to diagnose MRSA infection nor to guide or monitor treatment for MRSA infections.  Test performance is not FDA approved in patients less than 84 years old. Performed at [facility], [address] [place], [place] [address]      Labs: BNP (last 3 results) No results for input(s): BNP in the last 8760 hours. Basic Metabolic Panel: Recent Labs  Lab 11/18/24 2304 11/19/24 0203 11/19/24 2053 11/20/24 0030 11/20/24 0435 11/20/24 0949 11/20/24 1214 11/21/24 0701  NA 138   < > 140 140 142 140 138  --   K 3.9   < > 3.0* 3.0* 2.9* 2.9* 3.2*  --   CL 111   < > 112* 111 112* 111 108  --   CO2 9*   < > 17* 18* 19* 20* 20*  --   GLUCOSE 195*   < > 187* 169* 165* 182* 209*  --   BUN 11   < > 5* <5* <5* <5* <5*  --   CREATININE 0.86   < > 0.51 0.54 0.48 0.44 0.48  --    CALCIUM 8.2*   < > 7.7* 7.7* 7.8* 7.6* 7.6*  --   MG 1.8  --   --  1.7  --   --   --  1.9   < > = values in this interval not displayed.   Liver Function Tests: No results for input(s): AST, ALT, ALKPHOS, BILITOT, PROT, ALBUMIN in the last 168 hours. No results for input(s): LIPASE, AMYLASE in the last 168 hours. No results for input(s): AMMONIA in the last 168 hours. CBC: Recent Labs  Lab 11/18/24 1538 11/18/24 1852 11/20/24 0435 11/20/24 0949  WBC 16.0* 17.0* 6.5 4.7  NEUTROABS 12.9*  --   --   --   HGB 15.5* 14.8 10.7* 10.9*  HCT 50.6* 48.7* 30.6* 32.1*  MCV 87.5 87.3 78.9* 78.7*  PLT 287 251 165 166   Cardiac Enzymes: No results for input(s): CKTOTAL, CKMB, CKMBINDEX, TROPONINI in the last 168 hours. BNP: Invalid input(s): POCBNP CBG: Recent Labs  Lab 11/20/24 1043 11/20/24 1205 11/20/24 1624 11/20/24 2054 11/21/24 0758  GLUCAP 186* 197* 252* 216* 177*   D-Dimer No results for input(s): DDIMER in the last 72 hours. Hgb A1c Recent Labs    11/18/24 1850  HGBA1C 16.2*   Lipid Profile No results for input(s): CHOL, HDL, LDLCALC, TRIG, CHOLHDL, LDLDIRECT in the last 72 hours. Thyroid  function studies No results for input(s): TSH, T4TOTAL, T3FREE, THYROIDAB in the last 72 hours.  Invalid input(s): FREET3 Anemia work up No results for input(s): VITAMINB12, FOLATE, FERRITIN, TIBC, IRON, RETICCTPCT in the last 72 hours. Urinalysis    Component Value Date/Time   COLORURINE STRAW (A) 11/18/2024 1510   APPEARANCEUR CLEAR 11/18/2024 1510   LABSPEC 1.024 11/18/2024 1510   PHURINE 5.0 11/18/2024 1510   GLUCOSEU >=500 (A) 11/18/2024 1510   HGBUR SMALL (A) 11/18/2024 1510   BILIRUBINUR NEGATIVE 11/18/2024 1510   KETONESUR 80 (A) 11/18/2024 1510   PROTEINUR 100 (A) 11/18/2024 1510   UROBILINOGEN 0.2 09/17/2019 1612   NITRITE NEGATIVE 11/18/2024 1510   LEUKOCYTESUR NEGATIVE 11/18/2024 1510   Sepsis  Labs Recent Labs  Lab 11/18/24 1538 11/18/24 1852 11/20/24 0435 11/20/24 0949  WBC 16.0* 17.0* 6.5 4.7   Microbiology Recent Results (from the past 240 hours)  Resp panel by RT-PCR (RSV, Flu A&B, Covid) Anterior Nasal Swab     Status: Abnormal   Collection Time: 11/18/24  6:50 PM   Specimen: Anterior Nasal Swab  Result Value Ref Range Status   SARS  Coronavirus 2 by RT PCR NEGATIVE NEGATIVE Final    Comment: (NOTE) SARS-CoV-2 target nucleic acids are NOT DETECTED.  The SARS-CoV-2 RNA is generally detectable in upper respiratory specimens during the acute phase of infection. The lowest concentration of SARS-CoV-2 viral copies this assay can detect is 138 copies/mL. A negative result does not preclude SARS-Cov-2 infection and should not be used as the sole basis for treatment or other patient management decisions. A negative result may occur with  improper specimen collection/handling, submission of specimen other than nasopharyngeal swab, presence of viral mutation(s) within the areas targeted by this assay, and inadequate number of viral copies(<138 copies/mL). A negative result must be combined with clinical observations, patient history, and epidemiological information. The expected result is Negative.  Fact Sheet for Patients:  bloggercourse.com  Fact Sheet for Healthcare Providers:  seriousbroker.it  This test is no t yet approved or cleared by the [place]  FDA and  has been authorized for detection and/or diagnosis of SARS-CoV-2 by FDA under an Emergency Use Authorization (EUA). This EUA will remain  in effect (meaning this test can be used) for the duration of the COVID-19 declaration under Section 564(b)(1) of the Act, 21 U.S.C.section 360bbb-3(b)(1), unless the authorization is terminated  or revoked sooner.       Influenza A by PCR POSITIVE (A) NEGATIVE Final   Influenza B by PCR NEGATIVE NEGATIVE Final     Comment: (NOTE) The Xpert Xpress SARS-CoV-2/FLU/RSV plus assay is intended as an aid in the diagnosis of influenza from Nasopharyngeal swab specimens and should not be used as a sole basis for treatment. Nasal washings and aspirates are unacceptable for Xpert Xpress SARS-CoV-2/FLU/RSV testing.  Fact Sheet for Patients: bloggercourse.com  Fact Sheet for Healthcare Providers: seriousbroker.it  This test is not yet approved or cleared by the [place]  FDA and has been authorized for detection and/or diagnosis of SARS-CoV-2 by FDA under an Emergency Use Authorization (EUA). This EUA will remain in effect (meaning this test can be used) for the duration of the COVID-19 declaration under Section 564(b)(1) of the Act, 21 U.S.C. section 360bbb-3(b)(1), unless the authorization is terminated or revoked.     Resp Syncytial Virus by PCR NEGATIVE NEGATIVE Final    Comment: (NOTE) Fact Sheet for Patients: bloggercourse.com  Fact Sheet for Healthcare Providers: seriousbroker.it  This test is not yet approved or cleared by the [place]  FDA and has been authorized for detection and/or diagnosis of SARS-CoV-2 by FDA under an Emergency Use Authorization (EUA). This EUA will remain in effect (meaning this test can be used) for the duration of the COVID-19 declaration under Section 564(b)(1) of the Act, 21 U.S.C. section 360bbb-3(b)(1), unless the authorization is terminated or revoked.  Performed at [facility], [address] [place], [place] [address]   Respiratory (~20 pathogens) panel by PCR     Status: Abnormal   Collection Time: 11/18/24  6:50 PM   Specimen: Anterior Nasal Swab; Respiratory  Result Value Ref Range Status   Adenovirus NOT DETECTED NOT DETECTED Final   Coronavirus 229E NOT DETECTED NOT DETECTED Final    Comment: (NOTE) The Coronavirus on the  Respiratory Panel, DOES NOT test for the novel  Coronavirus (2019 nCoV)    Coronavirus HKU1 NOT DETECTED NOT DETECTED Final   Coronavirus NL63 NOT DETECTED NOT DETECTED Final   Coronavirus OC43 NOT DETECTED NOT DETECTED Final   Metapneumovirus NOT DETECTED NOT DETECTED Final   Rhinovirus / Enterovirus NOT DETECTED NOT DETECTED Final  Influenza A H3 DETECTED (A) NOT DETECTED Final   Influenza B NOT DETECTED NOT DETECTED Final   Parainfluenza Virus 1 NOT DETECTED NOT DETECTED Final   Parainfluenza Virus 2 NOT DETECTED NOT DETECTED Final   Parainfluenza Virus 3 NOT DETECTED NOT DETECTED Final   Parainfluenza Virus 4 NOT DETECTED NOT DETECTED Final   Respiratory Syncytial Virus NOT DETECTED NOT DETECTED Final   Bordetella pertussis NOT DETECTED NOT DETECTED Final   Bordetella Parapertussis NOT DETECTED NOT DETECTED Final   Chlamydophila pneumoniae NOT DETECTED NOT DETECTED Final   Mycoplasma pneumoniae NOT DETECTED NOT DETECTED Final    Comment: Performed at [facility], [address], [place], [place] [address]  MRSA Next Gen by PCR, Nasal     Status: None   Collection Time: 11/19/24  7:01 PM   Specimen: Nasal Mucosa; Nasal Swab  Result Value Ref Range Status   MRSA by PCR Next Gen NOT DETECTED NOT DETECTED Final    Comment: (NOTE) The GeneXpert MRSA Assay (FDA approved for NASAL specimens only), is one component of a comprehensive MRSA colonization surveillance program. It is not intended to diagnose MRSA infection nor to guide or monitor treatment for MRSA infections. Test performance is not FDA approved in patients less than 64 years old. Performed at [facility], [address], [place], [place] [address]      Time coordinating discharge: Over 35 minutes  SIGNED:   Erle Odell Castor, MD  [facility] 11/21/2024, 11:18 AM Pager   If 7PM-7AM, please contact night-coverage [email or web address] Password [email or web address]     [1] No Known Allergies

## 2024-11-21 NOTE — Plan of Care (Signed)
" °  Problem: Coping: Goal: Ability to adjust to condition or change in health will improve Outcome: Progressing   Problem: Nutritional: Goal: Maintenance of adequate nutrition will improve Outcome: Progressing   Problem: Skin Integrity: Goal: Risk for impaired skin integrity will decrease Outcome: Progressing   Problem: Respiratory: Goal: Will regain and/or maintain adequate ventilation Outcome: Progressing   Problem: Education: Goal: Knowledge of General Education information will improve Description: Including pain rating scale, medication(s)/side effects and non-pharmacologic comfort measures Outcome: Progressing   Problem: Clinical Measurements: Goal: Cardiovascular complication will be avoided Outcome: Progressing   Problem: Coping: Goal: Level of anxiety will decrease Outcome: Not Progressing   Problem: Elimination: Goal: Will not experience complications related to bowel motility Outcome: Progressing Goal: Will not experience complications related to urinary retention Outcome: Progressing   Problem: Pain Managment: Goal: General experience of comfort will improve and/or be controlled Outcome: Progressing   Problem: Safety: Goal: Ability to remain free from injury will improve Outcome: Progressing   "

## 2024-11-21 NOTE — Progress Notes (Signed)
 Pt's mother called to unit and spoke to this RN concerning missing discharge prescriptions. Hard scripts were faxed to [facility] on [address] per pt's mother's request.

## 2024-12-05 ENCOUNTER — Ambulatory Visit (INDEPENDENT_AMBULATORY_CARE_PROVIDER_SITE_OTHER)

## 2024-12-05 ENCOUNTER — Encounter (HOSPITAL_BASED_OUTPATIENT_CLINIC_OR_DEPARTMENT_OTHER): Payer: Self-pay

## 2024-12-05 VITALS — BP 108/73 | HR 98 | Ht 67.0 in | Wt 148.4 lb

## 2024-12-05 DIAGNOSIS — F332 Major depressive disorder, recurrent severe without psychotic features: Secondary | ICD-10-CM

## 2024-12-05 DIAGNOSIS — E1065 Type 1 diabetes mellitus with hyperglycemia: Secondary | ICD-10-CM

## 2024-12-05 DIAGNOSIS — Z7689 Persons encountering health services in other specified circumstances: Secondary | ICD-10-CM | POA: Diagnosis not present

## 2024-12-05 DIAGNOSIS — M7918 Myalgia, other site: Secondary | ICD-10-CM

## 2024-12-05 LAB — COMPREHENSIVE METABOLIC PANEL WITH GFR
ALT: 22 IU/L (ref 0–32)
AST: 21 IU/L (ref 0–40)
Albumin: 3.9 g/dL — ABNORMAL LOW (ref 4.0–5.0)
Alkaline Phosphatase: 62 IU/L (ref 41–116)
BUN/Creatinine Ratio: 17 (ref 9–23)
BUN: 11 mg/dL (ref 6–20)
Bilirubin Total: 0.3 mg/dL (ref 0.0–1.2)
CO2: 22 mmol/L (ref 20–29)
Calcium: 9.2 mg/dL (ref 8.7–10.2)
Chloride: 103 mmol/L (ref 96–106)
Creatinine, Ser: 0.64 mg/dL (ref 0.57–1.00)
Globulin, Total: 2.7 g/dL (ref 1.5–4.5)
Glucose: 136 mg/dL — ABNORMAL HIGH (ref 70–99)
Potassium: 4.1 mmol/L (ref 3.5–5.2)
Sodium: 141 mmol/L (ref 134–144)
Total Protein: 6.6 g/dL (ref 6.0–8.5)
eGFR: 128 mL/min/1.73

## 2024-12-05 LAB — MAGNESIUM: Magnesium: 1.6 mg/dL (ref 1.6–2.3)

## 2024-12-05 MED ORDER — ESCITALOPRAM OXALATE 10 MG PO TABS: 10.0000 mg | ORAL_TABLET | Freq: Every day | ORAL | 0 refills | Status: AC

## 2024-12-05 NOTE — Progress Notes (Signed)
 "   New Patient Office Visit  Subjective:   Kristina Sparks [birth year] 12/05/2024  Chief Complaint  Patient presents with   New Patient (Initial Visit)    Patient is here to get established with PCP. Would like diabetes management.     HPI: Kristina Sparks presents today to establish care at [facility] at [facility]. Introduced to publishing rights manager role and practice setting with verbalized understanding by patient.  All questions answered.   Last PCP: years Last annual physical: years Concerns: See below   T1DM: Patient is a 24 year old female who presents today to establish care.  Patient recently admitted to the hospital for DKA where her A1c was noted to be 16.  Patient was diagnosed with type 1 diabetes when she was a sophomore in college.  Patient was followed by endocrinology in [facility] however patient states due to difficulty getting medicine, appointment as well as confusion in the management of her diabetes she stopped taking insulin  in approximately April 2025.  During discussion with patient, patient feels that her diagnosis is overwhelming and frustrating and the fact that she has to check her diabetes 3 times a day as well as give herself insulin .  Patient states she requested an OmniPod at one point however it was not given to her because of her noncompliance with previous medications.  Patient states for the past 2 weeks since being home from the hospital, she has followed medication administration instructions, changed her diet, increase her exercise, and has tried to manage her diabetes as best she can.  Patient states both her mother and her boyfriend are helpful in reminding her to check her sugar as well as take her insulin .   MDD: Patient states she was placed on medication when she was first diagnosed with diabetes in which she took it for a month and states she was unsure if it really did anything to help her so she  never got the medication refilled.  Patient states she feels her depression overflows into her willingness to manage her own diabetes.  She feels like she has no hope to live a normal life and is just kind of constantly have to battle with her diabetes diagnosis and worrying how her diabetes affects her day-to-day life.    Myalgias: Patient states since she got home hospital she has noticed bilateral leg and shin pain.  Patient's mother suggested her potassium might be low so she took an oral supplement last night.  Patient denies any recent trauma or injury or anything in the hospital that would be causing her bilateral leg aches.  Patient does report she has increased her exercise and time will getting it to help with her diabetes but otherwise has not had any changes in her day-to-day activities  The following portions of the patient's history were reviewed and updated as appropriate: past medical history, past surgical history, family history, social history, allergies, medications, and problem list.   Patient Active Problem List   Diagnosis Date Noted   DKA (diabetic ketoacidosis) (HCC) 11/18/2024   Adjustment disorder with mixed anxiety and depressed mood 08/18/2022   MDD (major depressive disorder) 08/17/2022   MDD (major depressive disorder), recurrent episode, severe (HCC) 08/16/2022   Generalized anxiety disorder 08/16/2022   Type 1 diabetes mellitus with hyperglycemia (HCC) 07/25/2022   Past Medical History:  Diagnosis Date   Atypical squamous cells of undetermined significance (ASCUS) on Papanicolaou smear of cervix    needs  repeat Pap smear in 06/2024   Diabetes mellitus type 1, uncomplicated (HCC)    Vitamin D  deficiency    History reviewed. No pertinent surgical history. Family History  Problem Relation Age of Onset   Cancer Paternal Grandmother    Breast cancer Maternal Grandmother    Throat cancer Maternal Grandmother    Crohn's disease Maternal Uncle    Social History    Socioeconomic History   Marital status: Single    Spouse name: Not on file   Number of children: Not on file   Years of education: Not on file   Highest education level: Bachelor's degree (e.g., BA, AB, BS)  Occupational History   Not on file  Tobacco Use   Smoking status: Never   Smokeless tobacco: Never  Vaping Use   Vaping status: Never Used  Substance and Sexual Activity   Alcohol use: Yes   Drug use: Yes    Types: Marijuana   Sexual activity: Never  Other Topics Concern   Not on file  Social History Narrative   ** Merged History Encounter **       LIves at home with mother.    Social Drivers of Health   Tobacco Use: Low Risk (12/05/2024)   Patient History    Smoking Tobacco Use: Never    Smokeless Tobacco Use: Never    Passive Exposure: Not on file  Financial Resource Strain: Medium Risk (12/04/2024)   Overall Financial Resource Strain (CARDIA)    Difficulty of Paying Living Expenses: Somewhat hard  Food Insecurity: Food Insecurity Present (12/04/2024)   Epic    Worried About Programme Researcher, Broadcasting/film/video in the Last Year: Sometimes true    Ran Out of Food in the Last Year: Sometimes true  Transportation Needs: No Transportation Needs (12/04/2024)   Epic    Lack of Transportation (Medical): No    Lack of Transportation (Non-Medical): No  Physical Activity: Insufficiently Active (12/04/2024)   Exercise Vital Sign    Days of Exercise per Week: 2 days    Minutes of Exercise per Session: 30 min  Stress: Stress Concern Present (12/04/2024)   [employer] of Occupational Health - Occupational Stress Questionnaire    Feeling of Stress: To some extent  Social Connections: Moderately Integrated (12/04/2024)   Social Connection and Isolation Panel    Frequency of Communication with Friends and Family: More than three times a week    Frequency of Social Gatherings with Friends and Family: Once a week    Attends Religious Services: More than 4 times per year    Active Member  of Clubs or Organizations: Yes    Attends Banker Meetings: 1 to 4 times per year    Marital Status: Never married  Intimate Partner Violence: Not At Risk (11/20/2024)   Epic    Fear of Current or Ex-Partner: No    Emotionally Abused: No    Physically Abused: No    Sexually Abused: No  Depression (PHQ2-9): Low Risk (12/05/2024)   Depression (PHQ2-9)    PHQ-2 Score: 0  Alcohol Screen: Low Risk (12/04/2024)   Alcohol Screen    Last Alcohol Screening Score (AUDIT): 3  Housing: Unknown (12/04/2024)   Epic    Unable to Pay for Housing in the Last Year: Patient declined    Number of Times Moved in the Last Year: Not on file    Homeless in the Last Year: No  Utilities: Not At Risk (11/20/2024)   Epic    Threatened  with loss of utilities: No  Health Literacy: Not on file   Outpatient Medications Prior to Visit  Medication Sig Dispense Refill   insulin  aspart protamine - aspart (NOVOLOG  70/30 MIX) (70-30) 100 UNIT/ML FlexPen Inject 16 Units into the skin 2 (two) times daily. 15 mL 0   Insulin  Pen Needle 31G X 6 MM MISC 1 Device by Does not apply route 2 (two) times daily. 60 each 3   No facility-administered medications prior to visit.   Allergies[1]  ROS: A complete ROS was performed with pertinent positives/negatives noted in the HPI. The remainder of the ROS are negative.   Objective:   Today's Vitals   12/05/24 1322  BP: 108/73  Pulse: 98  SpO2: 98%  Weight: 148 lb 6.4 oz (67.3 kg)  Height: 5' 7 (1.702 m)    GENERAL: Well-appearing, in NAD. Well nourished. SKIN: Pink, warm and dry. No rash, lesion, ulceration, or ecchymoses.  RESPIRATORY: Chest wall symmetrical. Respirations even and non-labored. Breath sounds clear to auscultation bilaterally.  CARDIAC: S1, S2 present, regular rate and rhythm without murmur or gallops. Peripheral pulses 2+ bilaterally.  MSK: Muscle tone and strength appropriate for age. Joints w/o tenderness, redness, or swelling.   EXTREMITIES: Without clubbing, cyanosis, or edema.  NEUROLOGIC: No motor or sensory deficits. Steady, even gait. C2-C12 intact.  PSYCH/MENTAL STATUS: Alert, oriented x 3. Cooperative, flat affect with sad mood.    Health Maintenance Due  Topic Date Due   FOOT EXAM  Never done   OPHTHALMOLOGY EXAM  Never done   HPV VACCINES (2 - 2-dose series) 12/28/2013   Meningococcal B Vaccine (1 of 2 - Standard) Never done   Diabetic kidney evaluation - Urine ACR  Never done   Hepatitis C Screening  Never done   Pneumococcal Vaccine (1 of 2 - PCV) 06/23/2021   Hepatitis B Vaccines 19-59 Average Risk (1 of 3 - 19+ 3-dose series) Never done   COVID-19 Vaccine (4 - 2025-26 season) 07/22/2024    No results found for any visits on 12/05/24.     Assessment & Plan:  1. Encounter to establish care with new doctor (Primary) Discussed role of NP and expectations of the Primary Care Clinic. Discussed medical, surgical, and family history. .  2. Type 1 diabetes mellitus with hyperglycemia (HCC) I had a very open and candid conversation with the patient regarding her uncontrolled diabetes.  I discussed the importance of managing her diabetes even when she does not feel like it to help decrease her risks of major side effects due to uncontrolled diabetes.  It is very obvious that the patient does not want to have to deal with this diagnosis however I feel that once we can get her on a regimen where he she is controlled she will be more likely to continue to manage her diabetes herself.  I think she has a good support system with both her mother and her boyfriend, however I think her major hindrance to controlling her diabetes is herself and her own willingness.  I discussed placing a referral to endocrinology for further management of her insulin  as they have more knowledge in that area than I do.  I do believe an OmniPod would be beneficial for this patient as it would help decrease the amount of time she is having  to prick herself and I personally believe would lead to better adherence.  Discussed red flag symptoms with the patient and when to be seen in the ER.  Patient and  boyfriend verbalized understanding - Ambulatory referral to Endocrinology  3. Myalgia, lower leg I discussed that I feel that these are likely due to an increase in her activity level as well as a change in her daily activities, however I am going to recheck her electrolytes and magnesium  to make sure it is not due to an electrolyte imbalance.  I did discuss that I would let the patient know if there was any need for supplementation regarding potassium or magnesium  in which patient verbalized understanding.  I do expect that this is more muscle mediated related to change in activity level and recent hospitalization. - Comprehensive metabolic panel with GFR - Magnesium   4. Severe episode of recurrent major depressive disorder, without psychotic features (HCC) I feel one of the aggravating factors of her nonadherence to her diabetes is untreated depression.  I personally believe just based off of the conversation I had with the patient that her depression is in turn affecting how she manages her own health.  I discussed with the patient the concerns and frustration she has as well as her preference regarding medication or therapy.  Patient states that she would prefer medication as she felt that therapy was unhelpful in the past.  In discussing medication options patient stated that one of her friends is also on Lexapro  and felt that that would be beneficial for her to be on a similar medication as someone she trusts.  I believe she saw how the medication helped her friend and in turn could also help her.  I discussed medication compliance as well as risk and benefits  in which both her and her boyfriend verbalized understanding.  Patient to closely follow-up with PCP if medication is not beneficial for her. - escitalopram  (LEXAPRO ) 10 MG tablet;  Take 1 tablet (10 mg total) by mouth daily.  Dispense: 90 tablet; Refill: 0    Patient to reach out to office if new, worrisome, or unresolved symptoms arise or if no improvement in patient's condition. Patient verbalized understanding and is agreeable to treatment plan. All questions answered to patient's satisfaction.    Return in about 2 months (around 02/02/2025) for annual physical (fasting labs day of).    Lauraine Almarie Angus DNP, FNP-C     [1] No Known Allergies  "

## 2024-12-06 ENCOUNTER — Ambulatory Visit (HOSPITAL_BASED_OUTPATIENT_CLINIC_OR_DEPARTMENT_OTHER): Payer: Self-pay

## 2024-12-06 NOTE — Progress Notes (Signed)
 Hi Kristina Sparks, Your electrolyte levels are all normal as well as your kidney and liver function. I suspect your muscle aches to be due to changes in exercise as well as recent hospitalization. We will continue to keep a close eye on your symptoms. Please reach out with any concerns.  Lauraine Almarie Angus DNP, FNP-C

## 2024-12-26 ENCOUNTER — Telehealth (HOSPITAL_BASED_OUTPATIENT_CLINIC_OR_DEPARTMENT_OTHER): Payer: Self-pay

## 2024-12-26 DIAGNOSIS — E1065 Type 1 diabetes mellitus with hyperglycemia: Secondary | ICD-10-CM

## 2024-12-26 MED ORDER — INSULIN ASPART PROT & ASPART (70-30 MIX) 100 UNIT/ML PEN: 16.0000 [IU] | PEN_INJECTOR | Freq: Two times a day (BID) | SUBCUTANEOUS | 0 refills | Status: AC

## 2024-12-26 NOTE — Telephone Encounter (Signed)
 Copied from CRM [id number]. Topic: Clinical - Medication Refill >> Dec 26, 2024  2:01 PM Antwanette L wrote: Medication: insulin  aspart protamine - aspart (NOVOLOG  70/30 MIX) (70-30) 100 UNIT/ML FlexPen  Has the patient contacted their pharmacy? Yes. Pharmacy advised pt she ran out of refills and contact provider   This is the patient's preferred pharmacy:  [id number] - [place], [place] - [address] AT [facility] OF [address] [place] [address] Phone: [phone] Fax: [phone]  Is this the correct pharmacy for this prescription? Yes   Has the prescription been filled recently? Yes. Last refill was 11/21/24  Is the patient out of the medication? No. The pt is on her last pen  Has the patient been seen for an appointment in the last year OR does the patient have an upcoming appointment? Yes. Last office visit(new pt appt) with Lauraine Angus was on 12/05/24. Next app is on 07/18/25  Can we respond through MyChart? Yes  Agent: Please be advised that Rx refills may take up to 3 business days. We ask that you follow-up with your pharmacy. >> Dec 26, 2024  2:08 PM Antwanette L wrote: Dr. Erle Odell Castor  with [facility] last refilled the medicine on 11/21/24

## 2025-04-30 ENCOUNTER — Ambulatory Visit: Admitting: "Endocrinology

## 2025-07-18 ENCOUNTER — Encounter (HOSPITAL_BASED_OUTPATIENT_CLINIC_OR_DEPARTMENT_OTHER)
